# Patient Record
Sex: Male | Born: 1935
Health system: Southern US, Community
[De-identification: ages and names within clinical notes are randomized; demographics above are authoritative.]

## PROBLEM LIST (undated history)

## (undated) DIAGNOSIS — I2510 Atherosclerotic heart disease of native coronary artery without angina pectoris: Secondary | ICD-10-CM

## (undated) DIAGNOSIS — I10 Essential (primary) hypertension: Secondary | ICD-10-CM

## (undated) DIAGNOSIS — F17201 Nicotine dependence, unspecified, in remission: Secondary | ICD-10-CM

## (undated) DIAGNOSIS — Z9289 Personal history of other medical treatment: Secondary | ICD-10-CM

## (undated) DIAGNOSIS — E785 Hyperlipidemia, unspecified: Secondary | ICD-10-CM

## (undated) HISTORY — DX: Atherosclerotic heart disease of native coronary artery without angina pectoris: I25.10

## (undated) HISTORY — DX: Essential (primary) hypertension: I10

## (undated) HISTORY — DX: Personal history of other medical treatment: Z92.89

## (undated) HISTORY — PX: CARDIAC CATHETERIZATION: SHX172

## (undated) HISTORY — PX: HERNIA REPAIR: SHX51

## (undated) HISTORY — DX: Hyperlipidemia, unspecified: E78.5

## (undated) HISTORY — DX: Nicotine dependence, unspecified, in remission: F17.201

---

## 2002-06-02 HISTORY — PX: CORONARY ARTERY BYPASS GRAFT: SHX141

## 2002-10-31 ENCOUNTER — Encounter: Payer: Self-pay | Admitting: Emergency Medicine

## 2002-10-31 ENCOUNTER — Emergency Department (HOSPITAL_COMMUNITY): Admission: EM | Admit: 2002-10-31 | Discharge: 2002-10-31 | Payer: Self-pay | Admitting: Emergency Medicine

## 2002-10-31 ENCOUNTER — Inpatient Hospital Stay (HOSPITAL_COMMUNITY): Admission: EM | Admit: 2002-10-31 | Discharge: 2002-11-06 | Payer: Self-pay | Admitting: *Deleted

## 2002-11-01 ENCOUNTER — Encounter: Payer: Self-pay | Admitting: Surgery

## 2002-11-01 ENCOUNTER — Encounter: Payer: Self-pay | Admitting: Cardiothoracic Surgery

## 2002-11-02 ENCOUNTER — Encounter: Payer: Self-pay | Admitting: Cardiothoracic Surgery

## 2002-11-03 ENCOUNTER — Encounter: Payer: Self-pay | Admitting: Cardiothoracic Surgery

## 2002-11-04 ENCOUNTER — Encounter: Payer: Self-pay | Admitting: Cardiothoracic Surgery

## 2002-11-06 ENCOUNTER — Encounter: Payer: Self-pay | Admitting: Cardiothoracic Surgery

## 2002-12-23 ENCOUNTER — Encounter: Admission: RE | Admit: 2002-12-23 | Discharge: 2002-12-23 | Payer: Self-pay | Admitting: Cardiothoracic Surgery

## 2002-12-23 ENCOUNTER — Encounter: Payer: Self-pay | Admitting: Cardiothoracic Surgery

## 2007-11-19 ENCOUNTER — Ambulatory Visit (HOSPITAL_COMMUNITY): Admission: RE | Admit: 2007-11-19 | Discharge: 2007-11-19 | Payer: Self-pay | Admitting: *Deleted

## 2007-11-23 ENCOUNTER — Ambulatory Visit (HOSPITAL_COMMUNITY): Admission: RE | Admit: 2007-11-23 | Discharge: 2007-11-23 | Payer: Self-pay | Admitting: Cardiology

## 2010-10-18 NOTE — Discharge Summary (Signed)
NAME:  TEAL, BONTRAGER NO.:  [id number]   MEDICAL RECORD NO.:  [id number]                   PATIENT TYPE:  INP   LOCATION:  2034                                 FACILITY:  [facility]   PHYSICIAN:  Kerin Perna, M.D.               DATE OF BIRTH:  18-Sep-1924   DATE OF ADMISSION:  10/31/2002  DATE OF DISCHARGE:  11/06/2002                                 DISCHARGE SUMMARY   PRIMARY ADMITTING DIAGNOSIS:  Chest pain.   ADDITIONAL/DISCHARGE DIAGNOSES:  1. Coronary artery disease.  2. Hyperlipidemia.  3. Status post acute inferior myocardial infarction.  4. Benign prostatic hypertrophy.   PROCEDURES PERFORMED:  1. Cardiac catheterization.  2. Insertion of intra-aortic balloon pump.  3. Coronary artery bypass grafting x4 (left internal mammary artery to the     LAD, saphenous vein graft to the diagonal, saphenous vein graft to the     first obtuse marginal, saphenous vein graft to the distal right coronary     artery).  4. Endoscopic vein harvest right thigh.   HISTORY:  The patient is a 77 year old male who presented to the ER at [facility] complaining of chest pain which radiated to his right arm and  was associated with shortness of breath.  He was noted to have inferolateral  EKG changes and was, therefore, transferred to [facility] for  cardiac evaluation.  He was started on IV nitroglycerin, heparin,  Integrilin, and given an aspirin.  On admission he was seen by Dr. Jenne Campus  and was taken urgently to the catheterization laboratory for cardiac  catheterization.   HOSPITAL COURSE:  He was brought directly to the catheterization laboratory  and underwent cardiac catheterization which showed severe three-vessel  coronary artery disease including significant left main coronary stenosis.  Ejection fraction was about 45-55% with inferior hypokinesis.  He had an  intra-aortic balloon pump placed in the catheterization laboratory and  was  transferred to the floor in stable condition.  A cardiothoracic surgery  consultation was obtained, and it was felt that his best course of action  would be to proceed with surgical revascularization at that time.  He was  taken to the operating room on November 01, 2002, where he underwent CABG x4 as  described in detail above.  He tolerated the procedure well and was  transferred to the SICU in stable condition.  He was slowly weaned from the  ventilator and extubated.  He was hemodynamically stable and doing well on  postoperative day #1.  The intra-aortic balloon pump was slowly weaned and  discontinued later in the day on postoperative day #1.  He remained in the  ICU for further observation.  By postoperative day #2 he was stable and  ready for transfer to the floor.  Postoperatively he has done well.  He has  been ambulating in the halls without difficulty.  He has been  weaned off of  supplemental oxygen and is maintaining O2 saturations of 95% or greater on  room air.  His surgical incision sites are healing well.  He has been  started on Lasix for mild volume overload and is diuresing well.  He has  remained afebrile, and all vital signs have been stable throughout his  admission.  His laboratories have all remained stable, with most recent  hemoglobin and hematocrit 10.2 and 29.1 respectively.  All other  laboratories have been within normal limits.  If he remains stable, it is  thought he will be ready for discharge home on November 06, 2002.    DISCHARGE MEDICATIONS:  1. Enteric-coated aspirin 325 mg daily.  2. Lopressor 25 mg three times daily.  3. Altace 2.5 mg daily.  4. Zocor 30 mg q.h.s.  5. Hytrin 2 mg daily.  6. Tylox 1-2 q.4h. p.r.n. for pain.   DISCHARGE INSTRUCTIONS:  He is to refrain from driving, heavy lifting, or  strenuous activity.  He may continue walking daily and use his incentive  spirometer.  He is asked to shower daily and clean his incisions with soap   and water.   DISCHARGE FOLLOW-UP:  He will make an appointment to see Dr. Jenne Campus in two  weeks.  He will then follow up with Dr. Kathlee Nations Trigt in three weeks, and  our office will call to schedule this appointment.  He will go to the  [facility] for a chest x-ray one hour prior to this  appointment and will bring his films for Dr. Donata Clay to review.     Coral Ceo, P.A.                        Kerin Perna, M.D.    GC/MEDQ  D:  11/05/2002  T:  11/06/2002  Job:  [id number]   cc:   Kirk Ruths, M.D.  [address]  [place]  [place] [address]  Fax: [phone]

## 2010-10-18 NOTE — H&P (Signed)
NAME:  Brian Solomon, Brian Solomon                        ACCOUNT NO.:  [id number]   MEDICAL RECORD NO.:  [id number]                   PATIENT TYPE:  INP   LOCATION:                                       FACILITY:  [facility]   PHYSICIAN:  Darlin Priestly, M.D.             DATE OF BIRTH:  10/09/1924   DATE OF ADMISSION:  10/31/2002  DATE OF DISCHARGE:                                HISTORY & PHYSICAL   CHIEF COMPLAINT:  Chest pain.   HISTORY OF PRESENT ILLNESS:  Brian Solomon is a 77 year old male from  [place], [place], followed by Dr. Regino Schultze with no prior history of  coronary disease.  He presented to the emergency room at [facility]  with chest pain which radiated to his right arm associated with shortness of  breath.  He had some inferolateral elevation on EKG on arrival to [facility].  He was transferred to [facility] for  catheterization and further evaluation.  He was put on IV nitroglycerin.  He  was also started on heparin and given aspirin and Integrelin at [facility].  The patient was taken urgently to the catheterization lab by Dr.  Jenne Campus.   PAST MEDICAL HISTORY:  This is remarkably only for BPH.  He denies any  diabetes or hypertension.  No history of hyperlipidemia.   MEDICATIONS:  He takes Hytrin at home, otherwise no medications.   ALLERGIES:  No known drug allergies.   SOCIAL HISTORY:  He is married and has six children.  He is retired from  [employer] and does not smoke.   FAMILY HISTORY:  This is remarkable for coronary disease, his father, his  mother and one brother all died of an MI.   REVIEW OF SYSTEMS:  This is essentially unremarkable except for as noted  above.   PHYSICAL EXAMINATION:  GENERAL APPEARANCE:  He is a well-developed, well-  nourished African American male in no acute distress.  VITAL SIGNS:  On arrival blood pressure is 137/86, heart rate 56,  temperature 97, respiratory rate 12.  HEENT:  Normocephalic. Extraocular movements intact. Sclerae not icteric.  Lids and conjunctivae within normal limits.  NECK:  No JVD, no bruit.  CHEST:  Clear to auscultation bilaterally.  CARDIOVASCULAR:  Regular rate and rhythm without obvious murmurs, rubs, or  gallops.  Normal S1 and S2.  ABDOMEN:  Soft and nontender.  Bowel sounds are present.  There is no  obvious hepatosplenomegaly.  EXTREMITIES:  No edema.  Distal pulses are intact.  NEUROLOGIC:  Grossly intact.  He is awake, alert and oriented.  He is  cooperative and moves all extremities without obvious deficit.   LABORATORY DATA:  White count 7.9, hemoglobin 14.4, hematocrit 41.3,  platelets 133.  Sodium 137, potassium 3.7, BUN 18, creatinine 1.3, glucose  130.  CK 511, troponin 0.03.   Chest x-ray showed mild  pulmonary edema.   EKG showed sinus bradycardia with inferolateral ST elevation, inferior Qs.   IMPRESSION:  1. Acute diaphragmatic myocardial infarction.  2. History of benign prostatic hypertrophy.  3. Family history of coronary disease.   PLAN:  The patient was taken urgently to the catheterization lab by Dr.  Jenne Campus for further evaluation.     Brian Solomon, P.A.                      Darlin Priestly, M.D.    [name]  D:  11/03/2002  T:  11/03/2002  Job:  [id number]

## 2010-10-18 NOTE — Cardiovascular Report (Signed)
NAME:  Brian Solomon, Brian Solomon                        ACCOUNT NO.:  [id number]   MEDICAL RECORD NO.:  [id number]                   PATIENT TYPE:  INP   LOCATION:  2854                                 FACILITY:  [facility]   PHYSICIAN:  Darlin Priestly, M.D.             DATE OF BIRTH:  1925-05-20   DATE OF PROCEDURE:  10/31/2002  DATE OF DISCHARGE:                              CARDIAC CATHETERIZATION   PROCEDURE:  1. Left heart catheterization.  2. Coronary angiography.  3. Left ventriculogram.  4. Abdominal aortogram.  5. Insertion of intra-aortic balloon pump.   CARDIOLOGIST:  Darlin Priestly, M.D.   COMPLICATIONS:  None.   INDICATIONS:  The patient is a 77 year old male, patient of Dr. Regino Schultze in  [place], [place], with a history of BPH.  He presented to [facility] ER at approximately at 5 p.m. on Oct 31, 2002, with one hour of  substernal chest pain which occurred while working on his air conditioner at  home.  This was associated with nausea, diaphoresis, and emesis.  At  presentation to the ER, he was noted to have inferior and lateral ST  elevation.  He is now brought for emergent cardiac catheterization.  Of  note, the patient did have one episode of chest pain approximately one month  ago which was similar to this episode but not as severe. The patient has no  known history of coronary artery disease.   DESCRIPTION OF PROCEDURE:  After given informed written consent, the patient  was brought to the cardiac catheterization lab where his right and left  groins were shaved, prepped, and draped in the usual sterile fashion.  ECG  monitoring was established.  Using modified Seldinger technique, arterial  access was obtained in the right femoral artery and a #6 French arterial  sheath was inserted without complications.  Then, 6 French diagnostic  catheters were then used to perform diagnostic angiography.  This reveals a  large left main which is noted to be calcified  with 70% ostial stenosis.  There is pressure damping.   ANGIOGRAPHIC DATA:  1. The left anterior descending is a large vessel that courses to the apex     and gives rise to one large diagonal branch.  The LAD is diffusely     diseased in its proximal segment up to 70% with a mid 90% stenotic lesion     in the LAD.  2. There is one large diagonal which bifurcates in the mid segment.  It has     a 50% stenosis in the lower proximal limb of the bifurcation.  3. The left circumflex is a medium size vessel which courses to the AV     groove and gives rise to two obtuse marginal branches.  The AV groove     circumflex has no significant disease.  The first OM is a large vessel     with an 80%  proximal lesion.  The second OM is a small vessel with no     significant disease.  4. The right coronary artery is a large vessel which is dominant and gives     rise to the PDA as well as the posterior lateral branch.  There RCA has a     diffuse 70% proximal stenosis.  There is diffuse 70% mid RCA stenosis     with visible thrombus.  There is TIMI III flow into the distal vessel.     The PDA and posterior lateral branches are large vessels with no     significant disease.   LEFT VENTRICULOGRAM:  The left ventriculogram reveals mildly depressed EF at  45-50% with inferior hypokinesis.   ABDOMINAL AORTOGRAM:  The abdominal aortogram reveals 30% right and left  proximal renal artery stenosis.   HEMODYNAMICS:  1. Systemic arterial pressure is 100/58.  2. LV systolic pressure is 103/12.  3. LVEDP is 20.   DESCRIPTION OF PROCEDURE:  The right femoral sheath was then removed, and an  8 French intra-aortic balloon pump and sheath was then inserted without  difficulty into the right femoral artery.  The intra-aortic balloon pump was  then placed over the guidewire just distal to the left subclavian.  The  balloon pump was filled and pulsation was confirmed.  The patient did have a  good pulse in his  left brachial and radial artery with counter pulsation.   CONCLUSION:  1. Significant left main and three vessel coronary artery disease.  2. Mildly __________ systolic function with wall motion abnormalities as     noted above.  3. Mild bilateral renal artery stenosis.  4. Elevated left ventricular end-diastolic pressure.  5. Insertion of intra-aortic balloon pump through the right femoral artery     without complications.  6. Adjuvant use of Integrelin infusion.                                               Darlin Priestly, M.D.    RHM/MEDQ  D:  10/31/2002  T:  10/31/2002  Job:  [id number]   cc:   Kirk Ruths, M.D.  [address]  [place]  [place] [address]  Fax: [phone]

## 2010-10-18 NOTE — Consult Note (Signed)
NAME:  Brian Solomon, Brian Solomon                        ACCOUNT NO.:  [id number]   MEDICAL RECORD NO.:  [id number]                   PATIENT TYPE:  INP   LOCATION:  2854                                 FACILITY:  [facility]   PHYSICIAN:  Evelene Croon, M.D.                  DATE OF BIRTH:  10/18/1924   DATE OF CONSULTATION:  10/31/2002  DATE OF DISCHARGE:                                   CONSULTATION   REFERRING PHYSICIAN:  Darlin Priestly, M.D.   REASON FOR CONSULTATION:  Left main and severe three-vessel coronary artery  disease, status post acute inferior MI.   HISTORY OF PRESENT ILLNESS:  This patient is a 77 year old, previously  healthy gentleman, who has been asymptomatic until this afternoon when he  developed sudden onset of substernal chest pain.  There was severe radiation  into the right arm associated with shortness of breath, nausea and emesis  x2.  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  He said that  his symptoms improved quickly and he resisted coming to the hospital  initially, but his wife and daughter convinced him to go to the [facility]  Emergency Room.  He was noted to have inferolateral ST elevation on EKG.  He  was transferred to [facility] and was pain free on arrival with persistent  ST elevation while on Integrillin and intravenous nitroglycerin.  He did  have a bradycardic episode into the 30s.  He underwent emergent  catheterization which showed calcified 70% osteal left main stenosis with a  damping on catheter engagement.  The LAD had a 70% proximal stenosis at the  take off of the large diagonal branch.  There is about 90% mid LAD stenosis  beyond this.  The left circumflex had a large first marginal and 80%  stenosis.  The right coronary artery appeared to be the culprit with a 70%  proximal stenosis and a 90% mid vessel stenosis with thrombus present.  This  was a large vessel.  Left ventricular ejection fraction was about 45-55%  with  inferior hypokinesis.  He had intra-aortic balloon pump placed in the  catheterization lab due to his anatomy.  He remained pain free and  hemodynamically stable.   PAST MEDICAL HISTORY:  Otherwise negative.  He denies diabetes,  hypercholesterolemia and hypertension.  He has had BPH.   PAST SURGICAL HISTORY:  Status post hernia repair.   ALLERGIES:  No known drug allergies.   REVIEW OF SYMPTOMS:  GENERAL:  He denies fever or chills.  No recent weight  changes.  HEENT:  Eyes negative.  ENDOCRINE:  Denies diabetes and  hypothyroidism.  CARDIOVASCULAR:  Denies any previous history of chest pain  or pressure.  He has had no palpitations.  He denies PND and orthopnea.  He  has had no peripheral edema.  He denies fatigue.  RESPIRATORY:  Denies cough  and sputum  production.  He has had no shortness of breath.  GASTROINTESTINAL:  He has had no nausea or vomiting.  He denies melena and  bright red blood per rectum.  GENITOURINARY:  He denies dysuria and  hematuria.  He does have BPH.  NEUROLOGIC:  No focal weakness or numbness.  He denies dizziness and syncope.  He has never had a TIA or stroke.  MUSCULOSKELETAL:  He denies arthralgias and myalgias.  VASCULAR:  Denies  claudication.   SOCIAL HISTORY:  He is married and has six children.  He retired from  [employer].  He does not smoke or drink alcohol.   FAMILY HISTORY:  Strongly positive for heart disease.  Both his parents died  with myocardial infarction and one brother died with myocardial infarction.   PHYSICAL EXAMINATION:  VITAL SIGNS:  Blood pressure 130/80, pulse 56 and  regular, respirations 18 and unlabored.  GENERAL:  Well-developed African-American male in no distress.  HEENT:  Normocephalic, atraumatic.  Pupils equal round and reactive to light  and accommodation.  Extraocular movements intact.  Throat is clear.  NECK:  Normal carotid pulses bilaterally.  There are no bruits.  There is no  adenopathy or thyromegaly.   CARDIAC:  Regular rate and rhythm with a normal S1, S2.  There are no  murmurs, rubs or gallops.  LUNGS:  Clear.  ABDOMEN:  Active bowel sounds.  Soft and nontender.  There are no palpable  masses or organomegaly.  EXTREMITIES:  No peripheral edema.  Pedal pulses palpable bilaterally.   LABORATORY DATA AND X-RAY FINDINGS:  Electrocardiogram shows normal sinus  rhythm with inferolateral ST elevation.  Chest x-ray shows cardiomegaly with  questionable interstitial edema.  There are no lung lesions seen.   Hemoglobin 14.4, platelet count 177,000, white blood cell count 7.9.  Liver  function profile was normal.  Electrolytes are normal with a BUN of 18,  creatinine 1.3.  His CPK on arrival to [facility] was 511 with an MB of 6.4  and troponin I level of 6.3.   IMPRESSION:  This patient has left main and severe three-vessel coronary  disease with high-grade right coronary stenosis with thrombus present and  acute inferior MI.   RECOMMENDATIONS:  I agree that coronary bypass graft surgery is the best  treatment for this patient.  This should be performed tomorrow.  I see no  reason to perform surgery emergently since he is currently pain free and  hemodynamically stable.  I discussed the operative procedure with the  patient and his wife and family including alternatives, benefits and risks  which included bleeding, blood transfusion, infection, stroke, myocardial  infarction and death.  Also discussed the likelihood that Dr. Donata Clay  would be the one doing his surgery since I am going to be in the office  tomorrow.  They are in agreement with this.                                               Evelene Croon, M.D.    BB/MEDQ  D:  10/31/2002  T:  10/31/2002  Job:  [id number]

## 2010-10-18 NOTE — Op Note (Signed)
NAME:  Brian Solomon, Brian Solomon                        ACCOUNT NO.:  [id number]   MEDICAL RECORD NO.:  [id number]                   PATIENT TYPE:  INP   LOCATION:  2308                                 FACILITY:  [facility]   PHYSICIAN:  Kerin Perna III, M.D.           DATE OF BIRTH:  1925-01-13   DATE OF PROCEDURE:  11/01/2002  DATE OF DISCHARGE:                                 OPERATIVE REPORT   OPERATION:  1. Coronary artery bypass grafting x4; (left IMA to LAD, saphenous vein     graft to diagonal, saphenous vein graft to obtuse marginal, saphenous     vein graft to distal RCA).  2. Placement of pulmonary artery Swan-Ganz catheter.   SURGEON:  Kerin Perna, M.D.   ASSISTANT:  Toribio Harbour, N.P.   ANESTHESIA:  General.   ANESTHESIOLOGIST:  Guadalupe Maple, M.D.   INDICATIONS:  The patient is a 77 year old black male who presented with  acute onset of chest pain, and ruled in for MI with an acute BMI.  Cardiac  catheterization by Dr. Jenne Campus performed urgently demonstrated a high-grade  99% stenosis of the right coronary artery, 95% stenosis of the LAD, 80%  stenosis of the left main and 80% stenosis of the OM1.  He is felt to be a  candidate for surgical coronary revascularization, and a surgical  consultation was requested.  While in the cardiac catheterization lab a  balloon pump was placed for his persistent chest pain and bad coronary  anatomy.   Prior to surgery the patient was initially examined by Dr. Rexanne Mano, who  reviewed the situation with the cardiologist, the family, and the patient.  He scheduled the surgery to be performed by myself on November 01, 2002.  I had  met with the patient and family on the morning of November 01, 2002 in the ICU  prior to surgery, and reviewed the indications, benefits and risks of  coronary bypass surgery for treatment of his severe coronary disease.  The  patient understood that the procedure would improve his myocardial blood  flow  and relieve his symptoms, and increase his survival.  He understood the  risks he would be exposed to, including MI, CVA, bleeding, blood transfusion  requirement, infection and death.  He understood these implications for the  surgery, including the risks, and agreed to proceed with the operation as  planned under what I felt was an informed consent.   OPERATIVE FINDINGS:  The saphenous vein was harvested endoscopically from  the right thigh and was of good quality.  The mammary artery was a good  vessel with excellent flow.  The coronaries were good targets for grafting.  The patient had a clear coagulopathy from his preoperative Integrilin and  Heparin, and was given platelets and FFP in the operating room for  coagulopathy.  He was also given one unit of packed cells in the operating  room  for a hemoglobin less than 8.0 g.  The balloon pump was used as  previously placed in the catheterization lab.   DESCRIPTION OF PROCEDURE:  The patient was brought to the operating room and  placed supine on the operating room table, where general anesthesia was  induced.  The chest was prepped.  The anesthesiologist asked me to place the  Swan-Ganz catheter.  I prepped and draped the right subclavian area.  I  infiltrated the area with 1% lidocaine.  The subclavian vein was cannulated,  and a sheath was placed over a guide wire.  A Swan-Ganz catheter was then  floated into the pulmonary artery using the hemodynamic monitoring to  confirm placement.  The sheath and catheter were secured with a 2-0 silk  ligature.   The patient was then prepped from chin to toes, and draped as a sterile  field.  A sternal incision was made and the saphenous vein was harvested  from the right thigh, using the endoscopic vein technique.  The left  internal mammary artery was harvested as a pedicle graft from its origin at  the subclavian vessels.  It was a good vessel with excellent flow.  Heparin  was administered.   The sternal retractor was placed.  Pursestrings were  placed in the ascending aorta and right atrium, and the patient was  cannulated after confirming adequate ACT in the therapeutic range.  After  cannulating the aorta and right atrium, the patient was placed on bypass.  The aorta was examined and was mildly dilated; however, no more than 4 cm.  The coronaries were identified for grafting in the LAD, diagonal, OM1, and  right coronary artery were found to be adequate targets for grafting.  The  mammary artery and vein grafts were prepared for the distal anastomoses, and  cardioplegia catheters were placed for both antegrade and retrograde  delivery of cold blood cardioplegia.  The patient was cooled to 30 degrees.  As the aortic crossclamp was applied, a total of 750 cc of cold blood  cardioplegia was delivered in split doses between the antegrade aortic and  retrograde coronary sinus catheters.  There was a good cardioplegia address,  with septal temperature dropping less than 14 degrees.  Topical iced saline  slush was used to augment myocardial preservation and a pericardial  insulator pad was used to protect the left phrenic nerve.   The distal coronary anastomoses were then performed.  The first distal  anastomosis was of the distal RCA, just prior to the bifurcation.  This  vessel was a 1.8 - 2.0 mm vessel, and had a proximal 99% stenosis.  A  reverse saphenous vein was sewn end-to-side with running 7-0 Prolene, with  good flow through the graft.   The second distal anastomosis was placed at the OM1.  This was  intramyocardial, and was located just under the left atrial appendage.  This  was a 1.5 mm vessel and had a proximal 80% stenosis.  The reverse saphenous  vein was sewn end-to-side with running 7-0 Prolene, and there was good flow  through the graft.  Cardioplegia was redosed.   The third distal anastomosis was of the diagonal branch of the LAD.  This had a proximal 80-90%  stenosis.  It was a 1.5 mm vessel.  A reverse  saphenous vein was sewn end-to-side with running 7-0 Prolene.  There was  good flow through the graft.  Cardioplegia was redosed.   The fourth distal anastomosis was the mid  portion of the LAD.  This had a  proximal, long 95% stenosis.  The left internal mammary artery pedicle was  brought up through an opening, created in the left lateral pericardium.  It  was brought down onto the LAD and sewn end-to-side with a running 8-0  Prolene.  There was excellent flow through the anastomosis, which was  briefly opened with the release of the bulldog vascular clamp.  This clamp  was then reapplied and the pedicle was secured to the epicardium with two 6-  0 sutures.   After redosing cardioplegia, three proximal vein anastomoses were placed on  the ascending aorta, while the crossclamp remained in place.  Then 4.0 mm  punch and running 6-0 Prolene was used to construct three proximal venous  anastomoses.  Air was removed from the left side of the heart, using a dose  of warn blood retrograde cardioplegia (prior to release of the crossclamp  after the last proximal anastomosis suture line had been tied).   The heart resumed a spontaneous rhythm.  There was good flow through all  grafts, and hemostasis was documented at the proximal and distal  anastomoses.  The patient was rewarmed to 37 degrees.  Temporary pacing  wires were applied. The lungs were expanded and the ventilator was resumed.  When the patient reached 37 degrees he was weaned from bypass on __________  balloon pump at 1:1 augmentation.  There was stable blood pressure and good  hemodynamics.  Protamine was administered.  There was no adverse reaction to  the Protamine.  The cannulae were removed.  The mediastinum was irrigated  with warm antibiotic irrigation.  Platelets and SST were given for Evan's  coagulopathy.  A packed cell transfusion was given for a previous hemoglobin  of less  than  8 g.   This patient remained hemodynamically stable.  The mediastinum was irrigated  with warm antibiotic irrigation.  The leg incision was irrigated and closed  in a standard fashion.  The superior pericardium was closed with interrupted  sutures.  Two mediastinal and a left pleural chest tube were placed, and  brought out through separate incisions.  The sternum was reapproximated with  interrupted steel wire.  The pectoralis fascia was closed with interrupted  #1 Vicryl.  The subcutaneous and skin were closed with running Vicryl, and  sterile dressings were applied.   TOTAL BYPASS TIME:  120 min.   AORTIC CROSSCLAMP TIME:  75 min.                                               Mikey Bussing, M.D.    PV/MEDQ  D:  11/01/2002  T:  11/01/2002  Job:  [id number]   cc:   Darlin Priestly, M.D.  [phone] [address], [address]  [place]  [place] [address]  Fax: [phone]

## 2010-10-18 NOTE — Discharge Summary (Signed)
   NAME:  TOSH, GLAZE NO.:  [id number]   MEDICAL RECORD NO.:  [id number]                   PATIENT TYPE:  INP   LOCATION:  2034                                 FACILITY:  [facility]   PHYSICIAN:  Kerin Perna, M.D.               DATE OF BIRTH:  [birth year]   DATE OF ADMISSION:  10/31/2002  DATE OF DISCHARGE:  11/06/2002                                 DISCHARGE SUMMARY   ADDENDUM   Mr. Creasy was scheduled for discharge on November 06, 2002.  Overnight on November 05, 2002, he developed fever up to 101 degrees.  He is completely  asymptomatic and by the morning had returned to a baseline temperature of 97-  98 degrees.  The physical examination revealed no sources for fever.  A CBC  was obtained, which showed a white blood cell count of 9000, which was  stable from his previous labs.  A urinalysis was also obtained, which was  negative.  Chest x-ray showed some left lower lobe atelectasis, which had  been persistent since the time of surgery.  Also, his heart rate had  intermittently been elevated up into the low 100s overnight.  When he was  seen by cardiology, his Lopressor dose was increased to 50 mg b.i.d. and  this did trend downward after the administration of the first dose.  He was  observed closely throughout the morning and by lunchtime was deemed to be  stable and ready for discharge home.   DISCHARGE MEDICATIONS:  1. Enteric-coated aspirin 325 mg daily.  2. Lopressor 50 mg b.i.d.  3. Altace 2.5 mg daily.  4. Zocor 40 mg q.h.s.  5. __________ 10 mg daily.  6. Tylox one to two q.4h. p.r.n. for pain.   All of the remaining discharge instructions are unchanged from his  previously dictated discharge summary.     Coral Ceo, P.A.                        Kerin Perna, M.D.    GC/MEDQ  D:  11/06/2002  T:  11/06/2002  Job:  [id number]   cc:   [facility]

## 2010-12-23 ENCOUNTER — Encounter: Payer: Self-pay | Admitting: Cardiology

## 2010-12-23 ENCOUNTER — Ambulatory Visit (INDEPENDENT_AMBULATORY_CARE_PROVIDER_SITE_OTHER): Payer: Medicare Other | Admitting: Cardiology

## 2010-12-23 DIAGNOSIS — I10 Essential (primary) hypertension: Secondary | ICD-10-CM

## 2010-12-23 DIAGNOSIS — I2510 Atherosclerotic heart disease of native coronary artery without angina pectoris: Secondary | ICD-10-CM

## 2010-12-23 DIAGNOSIS — F17201 Nicotine dependence, unspecified, in remission: Secondary | ICD-10-CM

## 2010-12-23 DIAGNOSIS — E785 Hyperlipidemia, unspecified: Secondary | ICD-10-CM

## 2010-12-23 DIAGNOSIS — Z87891 Personal history of nicotine dependence: Secondary | ICD-10-CM

## 2010-12-23 NOTE — Progress Notes (Signed)
HPI:  [name] is a remarkably youthful-appearing and fit 77 year old with coronary artery disease self-referred for continuing cardiology care.  Since bypass surgery in 2004, he has done extremely well.  He denies all cardiopulmonary symptoms and has had excellent control of cardiovascular risk factors.  He was last seen by a cardiologist approximately 18 months ago.  A stress nuclear study in the past has shown minor inferior ischemia prompting cardiac catheterization in 2009 at which time a 60% left main lesion was seen as well as atrophy of the LIMA to the LAD and occlusion of an SVG to the first marginal.  Patient remains asymptomatic with good exercise tolerance and normal LV systolic function.  Current Outpatient Prescriptions  Medication Sig Dispense Refill  . aspirin 81 MG tablet Take 81 mg by mouth daily.        . clopidogrel (PLAVIX) 75 MG tablet Take 75 mg by mouth daily.        Marland Kitchen ezetimibe-simvastatin (VYTORIN) 10-40 MG per tablet Take 1 tablet by mouth at bedtime.        . metoprolol tartrate (LOPRESSOR) 25 MG tablet Take 25 mg by mouth as directed. Take 1/2 tab bid        . Tamsulosin HCl (FLOMAX) 0.4 MG CAPS Take 0.4 mg by mouth daily after supper.           No Known Allergies    Past Medical History  Diagnosis Date  . Arteriosclerotic cardiovascular disease (ASCVD)     acute IMI in 2004 with subsequent CABG; caffeine 2009 60% left main; atretic LIMA; occluded SVG to M1; patent grafts to RCA and D1;  EF 40-45% with inferior hypokinesis on echo in 3/11; pharmacologic stress nuclear in 3/11-EF of 51%, mild basilar inferolateral ischemia  . Hyperlipidemia     Lipid profile in 2009:159, 57, 72, 76  . Tobacco abuse, in remission     Quit in 1978  . Hypertension      Past Surgical History  Procedure Date  . Coronary artery bypass graft 2004     Family History  Problem Relation Age of Onset  . Heart disease Father     and a brother  . Stroke Mother     and brother       History   Social History  . Marital Status: Married    Spouse Name: N/A    Number of Children: N/A  . Years of Education: N/A   Occupational History  . Not on file.   Social History Main Topics  . Smoking status: Former Smoker -- 0.5 packs/day for 4 years    Types: Cigarettes    Quit date: 12/22/1965  . Smokeless tobacco: Never Used  . Alcohol Use: No  . Drug Use: No  . Sexually Active: Not on file   Other Topics Concern  . Not on file   Social History Narrative  . No narrative on file     ROS:  Requires corrective lenses for near vision; left cataract extraction; mild hearing impairment; urinary frequency; arthritic discomfort of the right elbow; right leg edema.   All other systems reviewed and are negative.  PHYSICAL EXAM: BP 139/88  Pulse 67  Ht 5\' 4"  (1.626 m)  Wt 68.947 kg (152 lb)  BMI 26.09 kg/m2  SpO2 94%  General-Well-developed; no acute distress Body Habitus-proportionate weight and height HEENT-[place]/AT; PERRL; EOM intact; conjunctiva and lids nl Neck-No JVD; no carotid bruits Endocrine-No thyromegaly Lungs-Clear lung fields; resonant percussion; normal I-to-E ratio  Cardiovascular- normal PMI; normal S1; increased intensity of S2 with normal split Abdomen-BS normal; soft and non-tender without masses or organomegaly Musculoskeletal-No deformities, cyanosis or clubbing Neurologic-Nl cranial nerves; symmetric strength and tone Skin- Warm, no significant lesions Extremities-Nl distal pulses; trace edema   ASSESSMENT AND PLAN:

## 2010-12-23 NOTE — Patient Instructions (Signed)
Your physician recommends that you continue on your current medications as directed. Please refer to the Current Medication list given to you today.  Your physician recommends that you schedule a follow-up appointment in: 1 year  

## 2010-12-26 NOTE — Progress Notes (Signed)
Records requested and received from Dr. Edison Simon office and placed in Dr. Marvel Plan records file./LV

## 2010-12-29 ENCOUNTER — Encounter: Payer: Self-pay | Admitting: Cardiology

## 2011-01-02 ENCOUNTER — Encounter: Payer: Self-pay | Admitting: Cardiology

## 2011-01-02 DIAGNOSIS — I2510 Atherosclerotic heart disease of native coronary artery without angina pectoris: Secondary | ICD-10-CM | POA: Insufficient documentation

## 2011-01-02 DIAGNOSIS — F17201 Nicotine dependence, unspecified, in remission: Secondary | ICD-10-CM | POA: Insufficient documentation

## 2011-01-02 DIAGNOSIS — E785 Hyperlipidemia, unspecified: Secondary | ICD-10-CM | POA: Insufficient documentation

## 2011-01-02 DIAGNOSIS — I10 Essential (primary) hypertension: Secondary | ICD-10-CM | POA: Insufficient documentation

## 2011-01-02 NOTE — Assessment & Plan Note (Signed)
Performance status is excellent.  We will concentrate on maintaining that and optimally controlling risk factors.

## 2011-01-02 NOTE — Assessment & Plan Note (Signed)
Recent lipid profile is good.  Current medication will be continued.

## 2011-01-02 NOTE — Assessment & Plan Note (Signed)
Good blood pressure control.  Continue current regimen.

## 2011-01-14 ENCOUNTER — Encounter: Payer: Self-pay | Admitting: Cardiology

## 2011-01-31 ENCOUNTER — Other Ambulatory Visit: Payer: Self-pay

## 2011-01-31 MED ORDER — CLOPIDOGREL BISULFATE 75 MG PO TABS: 75.0000 mg | ORAL_TABLET | Freq: Every day | ORAL | Status: DC

## 2011-04-01 ENCOUNTER — Other Ambulatory Visit: Payer: Self-pay | Admitting: *Deleted

## 2011-04-01 ENCOUNTER — Other Ambulatory Visit: Payer: Self-pay | Admitting: Cardiology

## 2011-04-01 MED ORDER — EZETIMIBE-SIMVASTATIN 10-40 MG PO TABS: 1.0000 | ORAL_TABLET | Freq: Every day | ORAL | Status: DC

## 2011-08-11 ENCOUNTER — Other Ambulatory Visit: Payer: Self-pay | Admitting: Cardiology

## 2011-08-11 MED ORDER — METOPROLOL TARTRATE 25 MG PO TABS: 25.0000 mg | ORAL_TABLET | ORAL | Status: DC

## 2011-12-11 ENCOUNTER — Encounter: Payer: Self-pay | Admitting: Cardiology

## 2011-12-15 ENCOUNTER — Ambulatory Visit (INDEPENDENT_AMBULATORY_CARE_PROVIDER_SITE_OTHER): Payer: Medicare Other | Admitting: Cardiology

## 2011-12-15 ENCOUNTER — Encounter: Payer: Self-pay | Admitting: Cardiology

## 2011-12-15 VITALS — BP 136/75 | HR 64 | Ht 64.0 in | Wt 153.0 lb

## 2011-12-15 DIAGNOSIS — I7090 Unspecified atherosclerosis: Secondary | ICD-10-CM

## 2011-12-15 DIAGNOSIS — I10 Essential (primary) hypertension: Secondary | ICD-10-CM

## 2011-12-15 DIAGNOSIS — E782 Mixed hyperlipidemia: Secondary | ICD-10-CM

## 2011-12-15 DIAGNOSIS — F17201 Nicotine dependence, unspecified, in remission: Secondary | ICD-10-CM

## 2011-12-15 DIAGNOSIS — I2510 Atherosclerotic heart disease of native coronary artery without angina pectoris: Secondary | ICD-10-CM

## 2011-12-15 DIAGNOSIS — E785 Hyperlipidemia, unspecified: Secondary | ICD-10-CM

## 2011-12-15 DIAGNOSIS — Z87891 Personal history of nicotine dependence: Secondary | ICD-10-CM

## 2011-12-15 NOTE — Assessment & Plan Note (Addendum)
Patient continues to do astoundingly well despite a less than optimal outcome from CABG surgery and mild asymptomatic stress-induced ischemia.  Optimal medical management will be continued.  Clopidogrel was added empirically, likely is not providing significant benefit, and thus will be discontinued.

## 2011-12-15 NOTE — Progress Notes (Deleted)
Name: Brian Solomon    DOB: July 18, 1924  Age: 78 y.o.  MR#: [id number]       PCP:  Kirk Ruths, MD      Insurance: @PAYORNAME @   CC:   No chief complaint on file.   VS BP 136/75  Pulse 64  Ht 5\' 4"  (1.626 m)  Wt 153 lb (69.4 kg)  BMI 26.26 kg/m2  Weights Current Weight  12/15/11 153 lb (69.4 kg)  12/23/10 152 lb (68.947 kg)    Blood Pressure  BP Readings from Last 3 Encounters:  12/15/11 136/75  12/23/10 139/88     Admit date:  (Not on file) Last encounter with RMR:  12/11/2011   Allergy No Known Allergies  Current Outpatient Prescriptions  Medication Sig Dispense Refill  . aspirin 81 MG tablet Take 81 mg by mouth daily.        Marland Kitchen ezetimibe-simvastatin (VYTORIN) 10-40 MG per tablet Take 1 tablet by mouth at bedtime.  30 tablet  12  . metoprolol tartrate (LOPRESSOR) 25 MG tablet Take 1 tablet (25 mg total) by mouth as directed. Take 1/2 tab bid  30 tablet  6  . PLAVIX 75 MG tablet TAKE 1 TABLET ONCE DAILY.  30 each  12  . Tamsulosin HCl (FLOMAX) 0.4 MG CAPS Take 0.4 mg by mouth daily after supper.          Discontinued Meds:   There are no discontinued medications.  Patient Active Problem List  Diagnosis  . Arteriosclerotic cardiovascular disease (ASCVD)  . Hyperlipidemia  . Tobacco abuse, in remission  . Hypertension    LABS No results found for any previous visit.   Results for this Opt Visit:    No results found for this or any previous visit.  EKG Orders placed in visit on 01/14/11  . EXERCISE TOLERANCE TEST     Prior Assessment and Plan Problem List as of 12/15/2011            Cardiology Problems   Arteriosclerotic cardiovascular disease (ASCVD)   Last Assessment & Plan Note   12/23/2010 Office Visit Signed 01/02/2011  1:07 PM by Kathlen Brunswick, MD    Performance status is excellent.  We will concentrate on maintaining that and optimally controlling risk factors.    Hyperlipidemia   Last Assessment & Plan Note   12/23/2010 Office Visit  Signed 01/02/2011  1:07 PM by Kathlen Brunswick, MD    Recent lipid profile is good.  Current medication will be continued.    Hypertension   Last Assessment & Plan Note   12/23/2010 Office Visit Signed 01/02/2011  1:08 PM by Kathlen Brunswick, MD    Good blood pressure control.  Continue current regimen.      Other   Tobacco abuse, in remission       Imaging: No results found.   FRS Calculation: Score not calculated

## 2011-12-15 NOTE — Assessment & Plan Note (Addendum)
Patient's blood pressure was mildly elevated at his last visit with [facility]; however, he does not recall the actual value.  He does not measure blood pressure at home, but when assessed in other venues, systolics are typically less than 140.  At this point, control of hypertension appears adequate with current therapy, which is modest and will be continued.  Appropriate laboratory studies are pending.  I will reassess this nice gentleman again next year.

## 2011-12-15 NOTE — Patient Instructions (Addendum)
Your physician recommends that you schedule a follow-up appointment in: 1 year  Your physician recommends that you return for lab work in: TODAY  Your physician has recommended you make the following change in your medication:  1 - STOP Plavix

## 2011-12-15 NOTE — Assessment & Plan Note (Signed)
Lipid profile will be repeated and therapy adjusted accordingly.

## 2011-12-15 NOTE — Progress Notes (Signed)
Patient ID: Brian Solomon, male   DOB: Jun 07, 1924, 78 y.o.   MRN: [id number]  HPI: Scheduled return visit for this delightful older gentleman with hypertension and coronary artery disease.  Since CABG surgery in 2004, he has done superbly with no cardiopulmonary symptoms and an excellent performance status.  He continues to function at a high level without any significant health problems.  Clopidogrel was added to his regime In 2009 following a catheterization which revealed moderate left main disease, atrophy of the LIMA graft and occlusion of a vein graft to the first marginal.  Patient was asymptomatic at the time but had a mildly abnormal stress test.  Prior to Admission medications   Medication Sig Start Date End Date Taking? Authorizing Provider  aspirin 81 MG tablet Take 81 mg by mouth daily.     Yes Historical Provider, MD  ezetimibe-simvastatin (VYTORIN) 10-40 MG per tablet Take 1 tablet by mouth at bedtime. 04/01/11  Yes Kathlen Brunswick, MD  metoprolol tartrate (LOPRESSOR) 25 MG tablet Take 1 tablet (25 mg total) by mouth as directed. Take 1/2 tab bid 08/11/11  Yes Kathlen Brunswick, MD  PLAVIX 75 MG tablet TAKE 1 TABLET ONCE DAILY. 04/01/11  Yes Kathlen Brunswick, MD  Tamsulosin HCl (FLOMAX) 0.4 MG CAPS Take 0.4 mg by mouth daily after supper.     Yes Historical Provider, MD   No Known Allergies    Past medical history, social history, and family history reviewed and updated.  ROS: Denies chest pain, dyspnea, orthopnea, PND, pedal edema, lightheadedness or syncope.  All other systems reviewed and are negative.  PHYSICAL EXAM: BP 136/75  Pulse 64  Ht 5\' 4"  (1.626 m)  Wt 69.4 kg (153 lb)  BMI 26.26 kg/m2  General-Well developed; no acute distress; mentally sharp Body habitus-proportionate weight and height Neck-No JVD; no carotid bruits Lungs-clear lung fields; resonant to percussion Cardiovascular-normal PMI; normal S1 and S2; S4 present Abdomen-normal bowel sounds; soft and  non-tender without masses or organomegaly Musculoskeletal-No deformities, no cyanosis or clubbing Neurologic-Normal cranial nerves; symmetric strength and tone Skin-Warm, no significant lesions Extremities-distal pulses intact; no edema   ASSESSMENT AND PLAN:   [name], MD 12/15/2011 2:39 PM

## 2011-12-16 ENCOUNTER — Encounter: Payer: Self-pay | Admitting: *Deleted

## 2011-12-17 ENCOUNTER — Encounter: Payer: Self-pay | Admitting: Cardiology

## 2011-12-19 LAB — LIPID PANEL
LDL Cholesterol: 88 mg/dL (ref 0–99)
VLDL: 11 mg/dL (ref 0–40)

## 2012-01-29 ENCOUNTER — Telehealth: Payer: Self-pay | Admitting: Cardiology

## 2012-01-29 NOTE — Telephone Encounter (Signed)
Pt needs to switch off vytorin due to co-payment being $90 can we switch him to something cheaper? Please call new rx to [facility]

## 2012-01-29 NOTE — Telephone Encounter (Signed)
Please advise 

## 2012-02-01 NOTE — Telephone Encounter (Signed)
Discontinue Vytorin. Atorvastatin 40 mg per day Fasting lipid profile in one month.

## 2012-02-03 ENCOUNTER — Encounter: Payer: Self-pay | Admitting: *Deleted

## 2012-02-03 ENCOUNTER — Other Ambulatory Visit: Payer: Self-pay | Admitting: *Deleted

## 2012-02-03 DIAGNOSIS — E782 Mixed hyperlipidemia: Secondary | ICD-10-CM

## 2012-02-03 MED ORDER — ATORVASTATIN CALCIUM 40 MG PO TABS: 40.0000 mg | ORAL_TABLET | Freq: Every day | ORAL | Status: DC

## 2012-02-03 NOTE — Telephone Encounter (Signed)
Patient notified of recommendations and chart updated to reflect changes.

## 2012-02-06 ENCOUNTER — Other Ambulatory Visit: Payer: Self-pay | Admitting: Cardiology

## 2012-03-04 ENCOUNTER — Other Ambulatory Visit: Payer: Self-pay | Admitting: Cardiology

## 2012-03-04 LAB — LIPID PANEL
Cholesterol: 174 mg/dL (ref 0–200)
HDL: 71 mg/dL (ref >39)
Total CHOL/HDL Ratio: 2.5 Ratio

## 2012-03-05 ENCOUNTER — Other Ambulatory Visit: Payer: Self-pay | Admitting: *Deleted

## 2012-03-05 DIAGNOSIS — E782 Mixed hyperlipidemia: Secondary | ICD-10-CM

## 2012-07-26 ENCOUNTER — Other Ambulatory Visit: Payer: Self-pay | Admitting: Cardiology

## 2012-07-26 MED ORDER — METOPROLOL TARTRATE 25 MG PO TABS: 12.5000 mg | ORAL_TABLET | Freq: Two times a day (BID) | ORAL | Status: DC

## 2012-10-05 ENCOUNTER — Encounter: Payer: Self-pay | Admitting: Nurse Practitioner

## 2012-10-05 NOTE — Progress Notes (Deleted)
Patient ID: Brian Solomon, male   DOB: 04-17-1925, 79 y.o.   MRN: [id number] A user error has taken place: {[id number]}.  This encounter was created in error - please disregard. This encounter was created in error - please disregard. This encounter was created in error - please disregard.

## 2012-10-05 NOTE — Progress Notes (Deleted)
errSubjective:     Patient ID: Brian Solomon, male   DOB: [birth year], 79 y.o.   MRN: [id number]  HPI   Review of Systems     Objective:   Physical Exam     Assessment:     ***    Plan:     ***    Err

## 2012-10-20 NOTE — Progress Notes (Signed)
This encounter was created in error - please disregard.

## 2012-12-13 ENCOUNTER — Ambulatory Visit (INDEPENDENT_AMBULATORY_CARE_PROVIDER_SITE_OTHER): Payer: Medicare Other | Admitting: Cardiovascular Disease

## 2012-12-13 ENCOUNTER — Encounter: Payer: Self-pay | Admitting: Cardiovascular Disease

## 2012-12-13 VITALS — BP 122/64 | HR 62 | Ht 64.0 in | Wt 150.8 lb

## 2012-12-13 DIAGNOSIS — Z951 Presence of aortocoronary bypass graft: Secondary | ICD-10-CM

## 2012-12-13 DIAGNOSIS — I2510 Atherosclerotic heart disease of native coronary artery without angina pectoris: Secondary | ICD-10-CM

## 2012-12-13 DIAGNOSIS — E785 Hyperlipidemia, unspecified: Secondary | ICD-10-CM

## 2012-12-13 DIAGNOSIS — I7090 Unspecified atherosclerosis: Secondary | ICD-10-CM

## 2012-12-13 DIAGNOSIS — I10 Essential (primary) hypertension: Secondary | ICD-10-CM

## 2012-12-13 NOTE — Progress Notes (Signed)
Patient ID: Brian Solomon, male   DOB: 1924/06/25, 79 y.o.   MRN: [id number]    SUBJECTIVE: Brian Solomon is an 79 y.o. gentleman with a PMH significant for hypertension and coronary artery disease. Since CABG surgery in 2004, he has done superbly with no cardiopulmonary symptoms and an excellent performance status. He continues to function at a high level without any significant health problems. Clopidogrel was added to his regime In 2009 following a catheterization which revealed moderate left main disease, atrophy of the LIMA graft and occlusion of a vein graft to the first marginal, but Dr. Dietrich Solomon discontinued it at his last visit. Patient was asymptomatic at the time but had a mildly abnormal stress test.   He has been married for 65 years, and they were high school sweethearts. He was born in [place] but moved to [place] in 1940. 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 He and his wife have 5 children, all of whom live in [place].  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  He denies chest pain and very seldom has shortness of breath, which he attributes to asthma. He denies palpitations, and his very mild leg swelling which is alleviated by keeping his legs up.    Filed Vitals:   12/13/12 1346  BP: 122/64  Pulse: 62  Height: 5\' 4"  (1.626 m)  Weight: 150 lb 12.8 oz (68.402 kg)     PHYSICAL EXAM General: NAD, does not appear his stated age. Neck: No JVD, no thyromegaly or thyroid nodule.  Lungs: Clear to auscultation bilaterally with normal respiratory effort. CV: Nondisplaced PMI.  Heart regular S1/S2, no S3/S4, no murmur.  No peripheral edema.  No carotid bruit.  Normal pedal pulses.  Abdomen: Soft, nontender, no hepatosplenomegaly, no distention.  Neurologic: Alert and oriented x 3.  Psych: Normal affect. Extremities: No clubbing or cyanosis.     LABS: Basic Metabolic Panel: No results  found for this basename: NA, K, CL, CO2, GLUCOSE, BUN, CREATININE, CALCIUM, MG, PHOS,  in the last 72 hours Liver Function Tests: No results found for this basename: AST, ALT, ALKPHOS, BILITOT, PROT, ALBUMIN,  in the last 72 hours No results found for this basename: LIPASE, AMYLASE,  in the last 72 hours CBC: No results found for this basename: WBC, NEUTROABS, HGB, HCT, MCV, PLT,  in the last 72 hours Cardiac Enzymes: No results found for this basename: CKTOTAL, CKMB, CKMBINDEX, TROPONINI,  in the last 72 hours BNP: No components found with this basename: POCBNP,  D-Dimer: No results found for this basename: DDIMER,  in the last 72 hours Hemoglobin A1C: No results found for this basename: HGBA1C,  in the last 72 hours Fasting Lipid Panel: No results found for this basename: CHOL, HDL, LDLCALC, TRIG, CHOLHDL, LDLDIRECT,  in the last 72 hours Thyroid Function Tests: No results found for this basename: TSH, T4TOTAL, FREET3, T3FREE, THYROIDAB,  in the last 72 hours Anemia Panel: No results found for this basename: VITAMINB12, FOLATE, FERRITIN, TIBC, IRON, RETICCTPCT,  in the last 72 hours  ECG: NSR, 62 bpm, old inferior MI    ASSESSMENT AND PLAN: 1. CAD s/p CABG: asymptomatic and stable. Continue present medication regimen.   Prentice Docker, M.D., F.A.C.C.

## 2012-12-13 NOTE — Patient Instructions (Addendum)
Your physician recommends that you schedule a follow-up appointment in: ONE YEAR 

## 2013-01-26 ENCOUNTER — Other Ambulatory Visit: Payer: Self-pay | Admitting: Cardiology

## 2013-08-29 ENCOUNTER — Telehealth: Payer: Self-pay | Admitting: *Deleted

## 2013-08-29 MED ORDER — ATORVASTATIN CALCIUM 40 MG PO TABS: ORAL_TABLET | ORAL | Status: DC

## 2013-08-29 NOTE — Telephone Encounter (Signed)
Pt usees [facility] in [place]  Needs atorvastatin called. We denide medication when pharmacy sent it to us because he needed follow up. Pt is not due for follow up till 11/2013  Pt has been out since friday

## 2013-08-29 NOTE — Telephone Encounter (Signed)
Medication sent via escribe. For Atorvastatin

## 2013-08-29 NOTE — Telephone Encounter (Signed)
[facility] [phone] f [phone]  Atorvastatin 40 mg #30

## 2013-08-29 NOTE — Telephone Encounter (Signed)
Refill rx as requested

## 2013-12-29 ENCOUNTER — Other Ambulatory Visit: Payer: Self-pay | Admitting: Adult Health

## 2013-12-29 ENCOUNTER — Other Ambulatory Visit: Payer: Self-pay | Admitting: Cardiovascular Disease

## 2014-03-20 ENCOUNTER — Ambulatory Visit (INDEPENDENT_AMBULATORY_CARE_PROVIDER_SITE_OTHER): Payer: Medicare Other | Admitting: Cardiovascular Disease

## 2014-03-20 ENCOUNTER — Encounter: Payer: Self-pay | Admitting: Cardiovascular Disease

## 2014-03-20 VITALS — BP 132/78 | HR 52 | Ht 64.0 in | Wt 148.0 lb

## 2014-03-20 DIAGNOSIS — Z951 Presence of aortocoronary bypass graft: Secondary | ICD-10-CM

## 2014-03-20 DIAGNOSIS — I10 Essential (primary) hypertension: Secondary | ICD-10-CM

## 2014-03-20 DIAGNOSIS — I2510 Atherosclerotic heart disease of native coronary artery without angina pectoris: Secondary | ICD-10-CM

## 2014-03-20 DIAGNOSIS — E785 Hyperlipidemia, unspecified: Secondary | ICD-10-CM

## 2014-03-20 NOTE — Progress Notes (Signed)
Patient ID: Brian Solomon, male   DOB: 1925-03-08, 80 y.o.   MRN: [id number]      SUBJECTIVE: The patient is an 80 year old male with a history of coronary artery disease and CABG as well as essential hypertension. The patient denies any symptoms of chest pain, palpitations, shortness of breath, lightheadedness, dizziness, leg swelling, orthopnea, PND, and syncope. He and his wife recently won a gift at church for being married the longest. ECG performed in the office today demonstrates normal sinus rhythm with old inferior infarct.   Soc: He has been married for 68 years, and they were high school sweethearts. He was born in [place] but moved to [place] in 1940. 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 He and his wife have 5 children, all of whom live in [place].  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   Review of Systems: As per "subjective", otherwise negative.  No Known Allergies  Current Outpatient Prescriptions  Medication Sig Dispense Refill  . aspirin 81 MG tablet Take 81 mg by mouth daily.        Marland Kitchen. atorvastatin (LIPITOR) 40 MG tablet TAKE (1) TABLET BY MOUTH ONCE DAILY.  30 tablet  3  . metoprolol tartrate (LOPRESSOR) 25 MG tablet TAKE (1/2) TABLET BY MOUTH TWICE DAILY.  30 tablet  0  . Tamsulosin HCl (FLOMAX) 0.4 MG CAPS Take 0.4 mg by mouth daily after supper.         No current facility-administered medications for this visit.    Past Medical History  Diagnosis Date  . Arteriosclerotic cardiovascular disease (ASCVD)     acute IMI in 2004 with subsequent CABG; cath 2009 60% left main; atretic LIMA; occluded SVG to M1; patent grafts to RCA and D1;  EF 40-45% with inferior hypokinesis on echo in 3/11; pharmacologic stress nuclear in 3/11-EF of 51%, mild basilar inferolateral ischemia  . Hyperlipidemia     Lipid profile in 2009:159, 57, 72, 76; 2012:176, 53, 76,  89  . Tobacco abuse, in remission     Quit in 1978  . Hypertension     CBC and CMet normal in 12/2010    Past Surgical History  Procedure Laterality Date  . Coronary artery bypass graft  2004    History   Social History  . Marital Status: Married    Spouse Name: N/A    Number of Children: 5  . Years of Education: N/A   Occupational History  . retired     [employer]   Social History Main Topics  . Smoking status: Former Smoker -- 0.50 packs/day for 4 years    Types: Cigarettes    Start date: 06/20/1942    Quit date: 12/22/1965  . Smokeless tobacco: Never Used  . Alcohol Use: No  . Drug Use: No  . Sexual Activity: Not on file   Other Topics Concern  . Not on file   Social History Narrative  . No narrative on file     Filed Vitals:   03/20/14 1428  BP: 132/78  Pulse: 52  Height: 5\' 4"  (1.626 m)  Weight: 148 lb (67.132 kg)    PHYSICAL EXAM General: NAD HEENT: Normal. Neck: No JVD, no thyromegaly. Lungs: Clear to auscultation bilaterally with normal respiratory effort. CV: Nondisplaced PMI.  Regular rate and rhythm, normal S1/S2, no S3/S4, no murmur. No pretibial or periankle edema.  No carotid bruit.  Normal pedal pulses.  Abdomen: Soft, nontender, no hepatosplenomegaly, no distention.  Neurologic: Alert and oriented x 3.  Psych: Normal affect. Skin: Normal. Musculoskeletal: Normal range of motion, no gross deformities. Extremities: No clubbing or cyanosis.   ECG: Most recent ECG reviewed.      ASSESSMENT AND PLAN: 1. CAD/CABG: Stable ischemic heart disease. Continue aspirin, Lipitor and metoprolol. 2. Essential HTN: Well controlled on current therapy which includes metoprolol. No medication changes.  Dispo: f/u 1 year.  Prentice DockerSuresh Viola Placeres, M.D., F.A.C.C.

## 2014-03-20 NOTE — Patient Instructions (Signed)
Your physician wants you to follow-up in: 1 year You will receive a reminder letter in the mail two months in advance. If you don't receive a letter, please call our office to schedule the follow-up appointment.    Your physician recommends that you continue on your current medications as directed. Please refer to the Current Medication list given to you today.     Thank you for choosing [facility] !  

## 2014-03-30 ENCOUNTER — Other Ambulatory Visit: Payer: Self-pay | Admitting: Cardiovascular Disease

## 2014-05-30 ENCOUNTER — Other Ambulatory Visit: Payer: Self-pay | Admitting: Adult Health

## 2015-01-16 ENCOUNTER — Encounter: Payer: Self-pay | Admitting: *Deleted

## 2015-02-06 ENCOUNTER — Encounter: Payer: Self-pay | Admitting: Internal Medicine

## 2015-03-23 DIAGNOSIS — E063 Autoimmune thyroiditis: Secondary | ICD-10-CM | POA: Diagnosis not present

## 2015-03-23 DIAGNOSIS — E782 Mixed hyperlipidemia: Secondary | ICD-10-CM | POA: Diagnosis not present

## 2015-03-23 DIAGNOSIS — Z0000 Encounter for general adult medical examination without abnormal findings: Secondary | ICD-10-CM | POA: Diagnosis not present

## 2015-03-23 DIAGNOSIS — Z1389 Encounter for screening for other disorder: Secondary | ICD-10-CM | POA: Diagnosis not present

## 2015-03-31 ENCOUNTER — Other Ambulatory Visit: Payer: Self-pay | Admitting: Cardiovascular Disease

## 2015-04-06 ENCOUNTER — Encounter: Payer: Self-pay | Admitting: Cardiovascular Disease

## 2015-04-06 ENCOUNTER — Ambulatory Visit (INDEPENDENT_AMBULATORY_CARE_PROVIDER_SITE_OTHER): Payer: Medicare Other | Admitting: Cardiovascular Disease

## 2015-04-06 VITALS — BP 118/72 | HR 86 | Ht 66.0 in | Wt 142.0 lb

## 2015-04-06 DIAGNOSIS — I10 Essential (primary) hypertension: Secondary | ICD-10-CM | POA: Diagnosis not present

## 2015-04-06 DIAGNOSIS — I25812 Atherosclerosis of bypass graft of coronary artery of transplanted heart without angina pectoris: Secondary | ICD-10-CM | POA: Diagnosis not present

## 2015-04-06 DIAGNOSIS — E785 Hyperlipidemia, unspecified: Secondary | ICD-10-CM

## 2015-04-06 NOTE — Patient Instructions (Signed)
Your physician wants you to follow-up in:  1 year with Dr Koneswaran You will receive a reminder letter in the mail two months in advance. If you don't receive a letter, please call our office to schedule the follow-up appointment.    Your physician recommends that you continue on your current medications as directed. Please refer to the Current Medication list given to you today.    If you need a refill on your cardiac medications before your next appointment, please call your pharmacy.     Thank you for choosing  [facility] !        

## 2015-04-06 NOTE — Progress Notes (Signed)
Patient ID: Brian Solomon, male   DOB: 1924/09/30, 81 y.o.   MRN: [id number]      SUBJECTIVE: The patient is an 81 year old male with a history of coronary artery disease and CABG as well as essential hypertension.  The patient denies any symptoms of chest pain, palpitations, shortness of breath, lightheadedness, dizziness, leg swelling, orthopnea, PND, and syncope. He and his wife recently won a gift at church for being married the longest.  He stays active and finds no reason to slow down.  A review of labs performed on 03/23/15 showed total cholesterol 176, triglycerides 58, HDL 82, LDL 82 as well. Hemoglobin 14.5, BUN 17, creatinine 1.13.  Soc: He has been married for 69 years, and they were high school sweethearts. He was born in [place] but moved to [place] in [place]. 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 He and his wife have 5 children, all of whom live in [place].  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   Review of Systems: As per "subjective", otherwise negative.  No Known Allergies  Current Outpatient Prescriptions  Medication Sig Dispense Refill  . aspirin 81 MG tablet Take 81 mg by mouth daily.      Marland Kitchen. atorvastatin (LIPITOR) 40 MG tablet TAKE (1) TABLET BY MOUTH ONCE DAILY. 30 tablet 0  . metoprolol tartrate (LOPRESSOR) 25 MG tablet TAKE (1/2) TABLET BY MOUTH TWICE DAILY. 30 tablet 11  . Tamsulosin HCl (FLOMAX) 0.4 MG CAPS Take 0.4 mg by mouth daily after supper.       No current facility-administered medications for this visit.    Past Medical History  Diagnosis Date  . Arteriosclerotic cardiovascular disease (ASCVD)     acute IMI in 2004 with subsequent CABG; cath 2009 60% left main; atretic LIMA; occluded SVG to M1; patent grafts to RCA and D1;  EF 40-45% with inferior hypokinesis on echo in 3/11; pharmacologic stress nuclear in 3/11-EF of 51%, mild  basilar inferolateral ischemia  . Hyperlipidemia     Lipid profile in 2009:159, 57, 72, 76; 2012:176, 53, 76, 89  . Tobacco abuse, in remission     Quit in 1978  . Hypertension     CBC and CMet normal in 12/2010  . History of myocardial perfusion scan 2009,2011    Past Surgical History  Procedure Laterality Date  . Coronary artery bypass graft  2004  . Cardiac catheterization      Social History   Social History  . Marital Status: Married    Spouse Name: N/A  . Number of Children: 5  . Years of Education: N/A   Occupational History  . retired     [employer]   Social History Main Topics  . Smoking status: Former Smoker -- 0.50 packs/day for 4 years    Types: Cigarettes    Start date: 06/20/1942    Quit date: 12/22/1965  . Smokeless tobacco: Never Used  . Alcohol Use: No  . Drug Use: No  . Sexual Activity: Not on file   Other Topics Concern  . Not on file   Social History Narrative     Filed Vitals:   04/06/15 1251  BP: 118/72  Pulse: 86  Height: 5\' 6"  (1.676 m)  Weight: 142 lb (64.411 kg)  SpO2: 94%    PHYSICAL EXAM General: NAD HEENT: Normal. Neck: No JVD, no thyromegaly. Lungs: Clear to auscultation bilaterally with  normal respiratory effort. CV: Nondisplaced PMI.  Regular rate and rhythm, normal S1/S2, no S3/S4, no murmur. No pretibial or periankle edema.  No carotid bruit.   Abdomen: Soft, nontender, no distention.  Neurologic: Alert and oriented x 3.  Psych: Normal affect. Skin: Normal. Musculoskeletal: Normal range of motion, no gross deformities. Extremities: No clubbing or cyanosis.   ECG: Most recent ECG reviewed.      ASSESSMENT AND PLAN: 1. CAD/CABG: Stable ischemic heart disease. Continue aspirin, Lipitor and metoprolol.  2. Essential HTN: Well controlled on current therapy which includes metoprolol. No medication changes.  3. Hyperlipidemia: Results noted above. Continue Lipitor at present dose.  Dispo: f/u 1  year.   Prentice Docker, M.D., F.A.C.C.

## 2015-04-30 ENCOUNTER — Other Ambulatory Visit: Payer: Self-pay | Admitting: Cardiovascular Disease

## 2015-05-03 ENCOUNTER — Ambulatory Visit (HOSPITAL_COMMUNITY): Payer: Medicare Other | Admitting: Oncology

## 2015-05-04 ENCOUNTER — Ambulatory Visit (HOSPITAL_COMMUNITY): Payer: Medicare Other | Admitting: Oncology

## 2015-05-04 ENCOUNTER — Telehealth (HOSPITAL_COMMUNITY): Payer: Self-pay | Admitting: Hematology & Oncology

## 2015-05-08 ENCOUNTER — Encounter (HOSPITAL_COMMUNITY): Payer: Self-pay

## 2015-05-14 DIAGNOSIS — H25811 Combined forms of age-related cataract, right eye: Secondary | ICD-10-CM | POA: Diagnosis not present

## 2015-09-10 DIAGNOSIS — E063 Autoimmune thyroiditis: Secondary | ICD-10-CM | POA: Diagnosis not present

## 2015-09-10 DIAGNOSIS — E781 Pure hyperglyceridemia: Secondary | ICD-10-CM | POA: Diagnosis not present

## 2015-09-10 DIAGNOSIS — Z6824 Body mass index (BMI) 24.0-24.9, adult: Secondary | ICD-10-CM | POA: Diagnosis not present

## 2015-09-10 DIAGNOSIS — I10 Essential (primary) hypertension: Secondary | ICD-10-CM | POA: Diagnosis not present

## 2015-09-10 DIAGNOSIS — Z1389 Encounter for screening for other disorder: Secondary | ICD-10-CM | POA: Diagnosis not present

## 2015-09-10 DIAGNOSIS — I2510 Atherosclerotic heart disease of native coronary artery without angina pectoris: Secondary | ICD-10-CM | POA: Diagnosis not present

## 2015-09-13 DIAGNOSIS — R7309 Other abnormal glucose: Secondary | ICD-10-CM | POA: Diagnosis not present

## 2015-10-03 DIAGNOSIS — Z719 Counseling, unspecified: Secondary | ICD-10-CM | POA: Diagnosis not present

## 2015-10-07 DIAGNOSIS — G4730 Sleep apnea, unspecified: Secondary | ICD-10-CM | POA: Diagnosis not present

## 2015-10-15 DIAGNOSIS — E781 Pure hyperglyceridemia: Secondary | ICD-10-CM | POA: Diagnosis not present

## 2015-10-15 DIAGNOSIS — R944 Abnormal results of kidney function studies: Secondary | ICD-10-CM | POA: Diagnosis not present

## 2015-10-15 DIAGNOSIS — R5383 Other fatigue: Secondary | ICD-10-CM | POA: Diagnosis not present

## 2015-10-27 ENCOUNTER — Other Ambulatory Visit: Payer: Self-pay | Admitting: Cardiovascular Disease

## 2015-11-13 DIAGNOSIS — H2511 Age-related nuclear cataract, right eye: Secondary | ICD-10-CM | POA: Diagnosis not present

## 2015-11-13 DIAGNOSIS — Z961 Presence of intraocular lens: Secondary | ICD-10-CM | POA: Diagnosis not present

## 2015-11-30 ENCOUNTER — Other Ambulatory Visit: Payer: Self-pay | Admitting: Cardiovascular Disease

## 2015-11-30 NOTE — Telephone Encounter (Signed)
Refill complete 

## 2015-12-03 NOTE — Patient Instructions (Signed)
Brian Solomon  12/03/2015     @PREFPERIOPPHARMACY @   Your procedure is scheduled on 12/11/2015.  [facility] at [facility].  Call this number if you have problems the morning of surgery:  [phone]   Remember:  Do not eat food or drink liquids after midnight.  Take these medicines the morning of surgery with A SIP OF WATER Losartan, Metoprolol, Flomax   Do not wear jewelry, make-up or nail polish.  Do not wear lotions, powders, or perfumes.  You may wear deoderant.  Do not shave 48 hours prior to surgery.  Men may shave face and neck.  Do not bring valuables to the hospital.  [facility] is not responsible for any belongings or valuables.  Contacts, dentures or bridgework may not be worn into surgery.  Leave your suitcase in the car.  After surgery it may be brought to your room.  For patients admitted to the hospital, discharge time will be determined by your treatment team.  Patients discharged the day of surgery will not be allowed to drive home.   Please read over the following fact sheets that you were given. Anesthesia Post-op Instructions   PATIENT INSTRUCTIONS POST-ANESTHESIA  IMMEDIATELY FOLLOWING SURGERY:  Do not drive or operate machinery for the first twenty four hours after surgery.  Do not make any important decisions for twenty four hours after surgery or while taking narcotic pain medications or sedatives.  If you develop intractable nausea and vomiting or a severe headache please notify your doctor immediately.  FOLLOW-UP:  Please make an appointment with your surgeon as instructed. You do not need to follow up with anesthesia unless specifically instructed to do so.  WOUND CARE INSTRUCTIONS (if applicable):  Keep a dry clean dressing on the anesthesia/puncture wound site if there is drainage.  Once the wound has quit draining you may leave it open to air.  Generally you should leave the bandage intact for twenty four hours unless there is drainage.   If the epidural site drains for more than 36-48 hours please call the anesthesia department.  QUESTIONS?:  Please feel free to call your physician or the hospital operator if you have any questions, and they will be happy to assist you.       A cataract is a clouding of the lens of the eye. When a lens becomes cloudy, vision is reduced based on the degree and nature of the clouding. Surgery may be needed to improve vision. Surgery removes the cloudy lens and usually replaces it with a substitute lens (intraocular lens, IOL). LET YOUR EYE DOCTOR KNOW ABOUT:  Allergies to food or medicine.  Medicines taken including herbs, eye drops, over-the-counter medicines, and creams.  Use of steroids (by mouth or creams).  Previous problems with anesthetics or numbing medicine.  History of bleeding problems or blood clots.  Previous surgery.  Other health problems, including diabetes and kidney problems.  Possibility of pregnancy, if this applies. RISKS AND COMPLICATIONS  Infection.  Inflammation of the eyeball (endophthalmitis) that can spread to both eyes (sympathetic ophthalmia).  Poor wound healing.  If an IOL is inserted, it can later fall out of proper position. This is very uncommon.  Clouding of the part of your eye that holds an IOL in place. This is called an "after-cataract." These are uncommon but easily treated. BEFORE THE PROCEDURE  Do not eat or drink anything except small amounts of water for 8 to 12 before your surgery, or as  directed by your caregiver.  Unless you are told otherwise, continue any eye drops you have been prescribed.  Talk to your primary caregiver about all other medicines that you take (both prescription and nonprescription). In some cases, you may need to stop or change medicines near the time of your surgery. This is most important if you are taking blood-thinning medicine.Do not stop medicines unless you are told to do so.  Arrange for someone to  drive you to and from the procedure.  Do not put contact lenses in either eye on the day of your surgery. PROCEDURE There is more than one method for safely removing a cataract. Your doctor can explain the differences and help determine which is best for you. Phacoemulsification surgery is the most common form of cataract surgery.  An injection is given behind the eye or eye drops are given to make this a painless procedure.  A small cut (incision) is made on the edge of the clear, dome-shaped surface that covers the front of the eye (cornea).  A tiny probe is painlessly inserted into the eye. This device gives off ultrasound waves that soften and break up the cloudy center of the lens. This makes it easier for the cloudy lens to be removed by suction.  An IOL may be implanted.  The normal lens of the eye is covered by a clear capsule. Part of that capsule is intentionally left in the eye to support the IOL.  Your surgeon may or may not use stitches to close the incision. There are other forms of cataract surgery that require a larger incision and stitches to close the eye. This approach is taken in cases where the doctor feels that the cataract cannot be easily removed using phacoemulsification. AFTER THE PROCEDURE  When an IOL is implanted, it does not need care. It becomes a permanent part of your eye and cannot be seen or felt.  Your doctor will schedule follow-up exams to check on your progress.  Review your other medicines with your doctor to see which can be resumed after surgery.  Use eye drops or take medicine as prescribed by your doctor.   This information is not intended to replace advice given to you by your health care provider. Make sure you discuss any questions you have with your health care provider.   Document Released: 05/08/2011 Document Revised: 06/09/2014 Document Reviewed: 05/08/2011 Elsevier Interactive Patient Education [employer]2016 [employer] Inc.

## 2015-12-05 ENCOUNTER — Encounter (HOSPITAL_COMMUNITY)
Admission: RE | Admit: 2015-12-05 | Discharge: 2015-12-05 | Disposition: A | Discharge status: Home / Self Care | Payer: Medicare Other | Source: Ambulatory Visit | Attending: Ophthalmology | Admitting: Ophthalmology

## 2015-12-05 ENCOUNTER — Encounter (HOSPITAL_COMMUNITY): Payer: Self-pay

## 2015-12-05 DIAGNOSIS — Z01812 Encounter for preprocedural laboratory examination: Secondary | ICD-10-CM | POA: Insufficient documentation

## 2015-12-05 LAB — CBC
HCT: 39.8 % (ref 39.0–52.0)
HEMOGLOBIN: 13.5 g/dL (ref 13.0–17.0)
MCH: 33.8 pg (ref 26.0–34.0)
MCHC: 33.9 g/dL (ref 30.0–36.0)
MCV: 99.7 fL (ref 78.0–100.0)
Platelets: 145 10*3/uL — ABNORMAL LOW (ref 150–400)
RBC: 3.99 MIL/uL — ABNORMAL LOW (ref 4.22–5.81)
RDW: 15.3 % (ref 11.5–15.5)
WBC: 4.8 10*3/uL (ref 4.0–10.5)

## 2015-12-05 LAB — BASIC METABOLIC PANEL
Anion gap: 7 (ref 5–15)
BUN: 21 mg/dL — ABNORMAL HIGH (ref 6–20)
CALCIUM: 8.8 mg/dL — AB (ref 8.9–10.3)
CO2: 25 mmol/L (ref 22–32)
Chloride: 105 mmol/L (ref 101–111)
Creatinine, Ser: 1.03 mg/dL (ref 0.61–1.24)
GFR calc non Af Amer: >60 mL/min (ref >60)
Glucose, Bld: 97 mg/dL (ref 65–99)
Potassium: 4.7 mmol/L (ref 3.5–5.1)
SODIUM: 137 mmol/L (ref 135–145)

## 2015-12-11 ENCOUNTER — Ambulatory Visit (HOSPITAL_COMMUNITY): Payer: Medicare Other | Admitting: Anesthesiology

## 2015-12-11 ENCOUNTER — Ambulatory Visit (HOSPITAL_COMMUNITY)
Admission: RE | Admit: 2015-12-11 | Discharge: 2015-12-11 | Disposition: A | Discharge status: Home / Self Care | Payer: Medicare Other | Source: Ambulatory Visit | Attending: Ophthalmology | Admitting: Ophthalmology

## 2015-12-11 ENCOUNTER — Encounter (HOSPITAL_COMMUNITY)
Admission: RE | Disposition: A | Discharge status: Home / Self Care | Payer: Self-pay | Source: Ambulatory Visit | Attending: Ophthalmology

## 2015-12-11 ENCOUNTER — Encounter (HOSPITAL_COMMUNITY): Payer: Self-pay | Admitting: *Deleted

## 2015-12-11 DIAGNOSIS — I2510 Atherosclerotic heart disease of native coronary artery without angina pectoris: Secondary | ICD-10-CM | POA: Insufficient documentation

## 2015-12-11 DIAGNOSIS — H25011 Cortical age-related cataract, right eye: Secondary | ICD-10-CM | POA: Diagnosis not present

## 2015-12-11 DIAGNOSIS — I252 Old myocardial infarction: Secondary | ICD-10-CM | POA: Diagnosis not present

## 2015-12-11 DIAGNOSIS — H2511 Age-related nuclear cataract, right eye: Secondary | ICD-10-CM | POA: Insufficient documentation

## 2015-12-11 DIAGNOSIS — H269 Unspecified cataract: Secondary | ICD-10-CM | POA: Diagnosis not present

## 2015-12-11 DIAGNOSIS — Z79899 Other long term (current) drug therapy: Secondary | ICD-10-CM | POA: Diagnosis not present

## 2015-12-11 DIAGNOSIS — Z87891 Personal history of nicotine dependence: Secondary | ICD-10-CM | POA: Diagnosis not present

## 2015-12-11 DIAGNOSIS — I10 Essential (primary) hypertension: Secondary | ICD-10-CM | POA: Diagnosis not present

## 2015-12-11 DIAGNOSIS — Z7982 Long term (current) use of aspirin: Secondary | ICD-10-CM | POA: Insufficient documentation

## 2015-12-11 DIAGNOSIS — J45909 Unspecified asthma, uncomplicated: Secondary | ICD-10-CM | POA: Insufficient documentation

## 2015-12-11 HISTORY — PX: CATARACT EXTRACTION W/PHACO: SHX586

## 2015-12-11 SURGERY — PHACOEMULSIFICATION, CATARACT, WITH IOL INSERTION
Anesthesia: Monitor Anesthesia Care | Site: Eye | Laterality: Right

## 2015-12-11 MED ORDER — EPINEPHRINE HCL 1 MG/ML IJ SOLN
INTRAOCULAR | Status: DC | PRN
  Administered 2015-12-11: 500 mL

## 2015-12-11 MED ORDER — EPINEPHRINE HCL 1 MG/ML IJ SOLN
INTRAMUSCULAR | Status: AC
  Filled 2015-12-11: qty 1

## 2015-12-11 MED ORDER — PROVISC 10 MG/ML IO SOLN
INTRAOCULAR | Status: DC | PRN
  Administered 2015-12-11: 0.85 mL via INTRAOCULAR

## 2015-12-11 MED ORDER — TETRACAINE HCL 0.5 % OP SOLN
1.0000 [drp] | OPHTHALMIC | Status: AC
  Administered 2015-12-11 (×3): 1 [drp] via OPHTHALMIC

## 2015-12-11 MED ORDER — PHENYLEPHRINE HCL 2.5 % OP SOLN
1.0000 [drp] | OPHTHALMIC | Status: AC
  Administered 2015-12-11 (×3): 1 [drp] via OPHTHALMIC

## 2015-12-11 MED ORDER — FENTANYL CITRATE (PF) 100 MCG/2ML IJ SOLN
25.0000 ug | INTRAMUSCULAR | Status: DC | PRN
  Administered 2015-12-11: 25 ug via INTRAVENOUS

## 2015-12-11 MED ORDER — MIDAZOLAM HCL 2 MG/2ML IJ SOLN
INTRAMUSCULAR | Status: AC
  Filled 2015-12-11: qty 2

## 2015-12-11 MED ORDER — CYCLOPENTOLATE-PHENYLEPHRINE 0.2-1 % OP SOLN
1.0000 [drp] | OPHTHALMIC | Status: AC
  Administered 2015-12-11 (×3): 1 [drp] via OPHTHALMIC

## 2015-12-11 MED ORDER — KETOROLAC TROMETHAMINE 0.5 % OP SOLN
1.0000 [drp] | OPHTHALMIC | Status: AC
  Administered 2015-12-11 (×3): 1 [drp] via OPHTHALMIC

## 2015-12-11 MED ORDER — BSS IO SOLN
INTRAOCULAR | Status: DC | PRN
  Administered 2015-12-11: 15 mL

## 2015-12-11 MED ORDER — FENTANYL CITRATE (PF) 100 MCG/2ML IJ SOLN
INTRAMUSCULAR | Status: AC
  Filled 2015-12-11: qty 2

## 2015-12-11 MED ORDER — LACTATED RINGERS IV SOLN
INTRAVENOUS | Status: DC
  Administered 2015-12-11 (×2): via INTRAVENOUS

## 2015-12-11 MED ORDER — MIDAZOLAM HCL 2 MG/2ML IJ SOLN
1.0000 mg | INTRAMUSCULAR | Status: DC | PRN
  Administered 2015-12-11: 1 mg via INTRAVENOUS

## 2015-12-11 SURGICAL SUPPLY — 10 items
CLOTH BEACON ORANGE TIMEOUT ST (SAFETY) ×2 IMPLANT
EYE SHIELD UNIVERSAL CLEAR (GAUZE/BANDAGES/DRESSINGS) ×3 IMPLANT
GLOVE BIO SURGEON STRL SZ 6.5 (GLOVE) ×1 IMPLANT
GLOVE BIO SURGEONS STRL SZ 6.5 (GLOVE) ×1
GLOVE EXAM NITRILE MD LF STRL (GLOVE) ×2 IMPLANT
LENS ALC ACRYL/TECN (Ophthalmic Related) ×3 IMPLANT
PAD ARMBOARD 7.5X6 YLW CONV (MISCELLANEOUS) ×2 IMPLANT
TAPE SURG TRANSPORE 1 IN (GAUZE/BANDAGES/DRESSINGS) ×1 IMPLANT
TAPE SURGICAL TRANSPORE 1 IN (GAUZE/BANDAGES/DRESSINGS) ×2
WATER STERILE IRR 250ML POUR (IV SOLUTION) ×2 IMPLANT

## 2015-12-11 NOTE — Anesthesia Preprocedure Evaluation (Signed)
Anesthesia Evaluation  Patient identified by MRN, date of birth, ID band Patient awake    Reviewed: reviewed documented beta blocker date and time   Airway Mallampati: II  TM Distance: >3 FB     Dental  (+) Partial Upper   Pulmonary former smoker,    breath sounds clear to auscultation       Cardiovascular hypertension, Pt. on home beta blockers and Pt. on medications (-) angina+ CAD and + Past MI   Rhythm:Regular Rate:Normal     Neuro/Psych    GI/Hepatic negative GI ROS,   Endo/Other    Renal/GU      Musculoskeletal   Abdominal   Peds  Hematology   Anesthesia Other Findings   Reproductive/Obstetrics                             Anesthesia Physical Anesthesia Plan  ASA: III  Anesthesia Plan: MAC   Post-op Pain Management:    Induction: Intravenous  Airway Management Planned: Nasal Cannula  Additional Equipment:   Intra-op Plan:   Post-operative Plan:   Informed Consent: I have reviewed the patients History and Physical, chart, labs and discussed the procedure including the risks, benefits and alternatives for the proposed anesthesia with the patient or authorized representative who has indicated his/her understanding and acceptance.     Plan Discussed with:   Anesthesia Plan Comments:         Anesthesia Quick Evaluation

## 2015-12-11 NOTE — Anesthesia Postprocedure Evaluation (Signed)
Anesthesia Post Note  Patient: Brian Solomon  Procedure(s) Performed: Procedure(s) (LRB): CATARACT EXTRACTION PHACO AND INTRAOCULAR LENS PLACEMENT (IOC) (Right)  Patient location during evaluation: PACU Level of consciousness: awake and alert and oriented Pain management: pain level controlled Vital Signs Assessment: post-procedure vital signs reviewed and stable Respiratory status: spontaneous breathing Cardiovascular status: blood pressure returned to baseline, stable and bradycardic Postop Assessment: no headache, no backache, no signs of nausea or vomiting and adequate PO intake    Last Vitals:  Filed Vitals:   12/11/15 0750 12/11/15 0816  BP: 138/80 158/78  Pulse:  47  Temp:  36.4 C  Resp: 11 16    Last Pain: There were no vitals filed for this visit.               Tsutomu Barfoot

## 2015-12-11 NOTE — Op Note (Signed)
Patient brought to the operating room and prepped and draped in the usual manner.  Lid speculum inserted in right eye.  Stab incision made at the twelve o'clock position.  Provisc instilled in the anterior chamber.   A 2.4 mm. Stab incision was made temporally.  An anterior capsulotomy was done with a bent 25 gauge needle.  The nucleus was hydrodissected.  The Phaco tip was inserted in the anterior chamber and the nucleus was emulsified.  CDE was 9.44.  The cortical material was then removed with the I and A tip.  Posterior capsule was the polished.  The anterior chamber was deepened with Provisc.  A 21.0 Diopter Alcon [id number] IOL was then inserted in the capsular bag.  Provisc was then removed with the I and A tip.  The wound was then hydrated.  Patient sent to the Recovery Room in good condition with follow up in my office.  Preoperative Diagnosis:  Cortical and Nuclear Cataract OD Postoperative Diagnosis:  Same Procedure name: Kelman Phacoemulsification OD with IOL

## 2015-12-11 NOTE — Discharge Instructions (Signed)
°  °          [facility] Instructions °[address]- [place] [address] °    ° °1. Avoid closing eyes tightly. One often closes the eye tightly when laughing, talking, sneezing, coughing or if they feel irritated. At these times, you should be careful not to close your eyes tightly. ° °2. Instill eye drops as instructed. To instill drops in your eye, open it, look up and have someone gently pull the lower lid down and instill a couple of drops inside the lower lid. ° °3. Do not touch upper lid. ° °4. Take Advil or Tylenol for pain. ° °5. You may use either eye for near work, such as reading or sewing and you may watch television. ° °6. You may have your hair done at the beauty parlor at any time. ° °7. Wear dark glasses with or without your own glasses if you are in bright light. ° °8. Call our office at [phone] or [phone] if you have sharp pain in your eye or unusual symptoms. ° °9.  FOLLOW UP WITH DR. SHAPIRO TODAY IN HIS [place] OFFICE AT 2:45pm. ° °  °I have received a copy of the above instructions and will follow them.  ° ° ° °IF YOU ARE IN IMMEDIATE DANGER CALL 911! ° °It is important for you to keep your follow-up appointment with your physician after discharge, OR, for you /your caregiver to make a follow-up appointment with your physician / medical provider after discharge. ° °Show these instructions to the next healthcare provider you see. ° °

## 2015-12-11 NOTE — H&P (Signed)
The patient was re examined and there is no change in the patients condition since the original H and P. 

## 2015-12-11 NOTE — Transfer of Care (Signed)
Immediate Anesthesia Transfer of Care Note  Patient: Brian Solomon  Procedure(s) Performed: Procedure(s) with comments: CATARACT EXTRACTION PHACO AND INTRAOCULAR LENS PLACEMENT (IOC) (Right) - CDE: 9.44  Patient Location: PACU  Anesthesia Type:MAC  Level of Consciousness: awake, alert , oriented and patient cooperative  Airway & Oxygen Therapy: Patient Spontanous Breathing  Post-op Assessment: Report given to RN and Post -op Vital signs reviewed and stable  Post vital signs: Reviewed and stable  Last Vitals:  Filed Vitals:   12/11/15 0750 12/11/15 0816  BP: 138/80 158/78  Pulse:  47  Temp:  36.4 C  Resp: 11 16    Last Pain: There were no vitals filed for this visit.    Patients Stated Pain Goal: 5 (12/11/15 0704)  Complications: No apparent anesthesia complications

## 2015-12-13 ENCOUNTER — Encounter (HOSPITAL_COMMUNITY): Payer: Self-pay | Admitting: Ophthalmology

## 2015-12-18 ENCOUNTER — Encounter: Payer: Self-pay | Admitting: Adult Health

## 2015-12-18 ENCOUNTER — Ambulatory Visit (INDEPENDENT_AMBULATORY_CARE_PROVIDER_SITE_OTHER): Payer: Medicare Other | Admitting: Adult Health

## 2015-12-18 VITALS — BP 140/76 | HR 73 | Ht 64.0 in | Wt 143.0 lb

## 2015-12-18 DIAGNOSIS — I10 Essential (primary) hypertension: Secondary | ICD-10-CM | POA: Diagnosis not present

## 2015-12-18 DIAGNOSIS — I2510 Atherosclerotic heart disease of native coronary artery without angina pectoris: Secondary | ICD-10-CM

## 2015-12-18 DIAGNOSIS — R55 Syncope and collapse: Secondary | ICD-10-CM | POA: Diagnosis not present

## 2015-12-18 NOTE — Progress Notes (Signed)
Cardiology Office Note   Date:  12/18/2015   ID:  Brian Solomon, DOB [birth year], MRN [id number]  PCP:  Cassell Smiles., MD  Cardiologist: Joni Reining, NP Brian Solomon  No chief complaint on file.     History of Present Illness: Brian Solomon is a 82 y.o. male who presents for ongoing assessment and management of CAD, hx of CABG and hypertension. He was seen by Dr. Purvis Solomon in 04/2015 and was stable from cardiology standpoint. He was to be seen again in one year.   He comes today with a near syncopal episode. He states about 6 years ago he had something similar when driving. He saw cardiologist and was placed on metoprolol. He had not further symptoms until 3 days ago. He was sitting in the mall,waiting for his wife to finish shopping. States he sat there for one hour before she returned. When he stood up, he felt a gray blackness come over his eyes. This lasted seconds and went away on its own. He felt a little weak. He denies chest pain, racing HR or dyspnea. It concerned him enough to have it checked out.    Past Medical History  Diagnosis Date  . Arteriosclerotic cardiovascular disease (ASCVD)     acute IMI in 2004 with subsequent CABG; cath 2009 60% left main; atretic LIMA; occluded SVG to M1; patent grafts to RCA and D1;  EF 40-45% with inferior hypokinesis on echo in 3/11; pharmacologic stress nuclear in 3/11-EF of 51%, mild basilar inferolateral ischemia  . Hyperlipidemia     Lipid profile in 2009:159, 57, 72, 76; 2012:176, 53, 76, 89  . Tobacco abuse, in remission     Quit in 1978  . Hypertension     CBC and CMet normal in 12/2010  . History of myocardial perfusion scan 2009,2011    Past Surgical History  Procedure Laterality Date  . Coronary artery bypass graft  2004  . Cardiac catheterization    . Hernia repair    . Cataract extraction w/phaco Right 12/11/2015    Procedure: CATARACT EXTRACTION PHACO AND INTRAOCULAR LENS PLACEMENT (IOC);  Surgeon: Jethro Bolus, MD;  Location: [facility];  Service: Ophthalmology;  Laterality: Right;  CDE: 9.44     Current Outpatient Prescriptions  Medication Sig Dispense Refill  . aspirin 81 MG tablet Take 81 mg by mouth daily.      Marland Kitchen atorvastatin (LIPITOR) 40 MG tablet TAKE (1) TABLET BY MOUTH ONCE DAILY. 30 tablet 3  . losartan (COZAAR) 50 MG tablet Take 50 mg by mouth daily.    . metoprolol tartrate (LOPRESSOR) 25 MG tablet TAKE (1/2) TABLET BY MOUTH 2 TIMES A DAY. 30 tablet 6  . Tamsulosin HCl (FLOMAX) 0.4 MG CAPS Take 0.4 mg by mouth daily after supper.       No current facility-administered medications for this visit.    Allergies:   Review of patient's allergies indicates no known allergies.    Social History:  The patient  reports that he quit smoking about 50 years ago. His smoking use included Cigarettes. He started smoking about 73 years ago. He has a 2 pack-year smoking history. He has never used smokeless tobacco. He reports that he does not drink alcohol or use illicit drugs.   Family History:  The patient's family history includes Heart disease in his father; Stroke in his mother.    ROS: All other systems are reviewed and negative. Unless otherwise mentioned in H&P    PHYSICAL EXAM: VS:  There  were no vitals taken for this visit. , BMI There is no weight on file to calculate BMI. GEN: Well nourished, well developed, in no acute distress HEENT: normal Neck: no JVD, no carotid bruits, or masses Cardiac:RRR; no murmurs, rubs, or gallops,no edema  Respiratory:  clear to auscultation bilaterally, normal work of breathing GI: soft, nontender, nondistended, + BS MS: no deformity or atrophy Skin: warm and dry, no rash Neuro:  Strength and sensation are intact Psych: euthymic mood, full affect   EKG: The ekg ordered today demonstrates sinus bradycardia, rate of 54 bpm. T-wave flattening in the lateral leads.    Recent Labs: 12/05/2015: BUN 21*; Creatinine, Ser 1.03; Hemoglobin 13.5;  Platelets 145*; Potassium 4.7; Sodium 137    Lipid Panel    Component Value Date/Time   CHOL 174 03/04/2012 0821   TRIG 58 03/04/2012 0821   HDL 71 03/04/2012 0821   CHOLHDL 2.5 03/04/2012 0821   VLDL 12 03/04/2012 0821   LDLCALC 91 03/04/2012 0821      Wt Readings from Last 3 Encounters:  12/11/15 144 lb (65.318 kg)  12/05/15 144 lb (65.318 kg)  04/06/15 142 lb (64.411 kg)      ASSESSMENT AND PLAN:  1.  Near syncope: Lasted for a few seconds after siting for an hour. No associated symptoms. I have had orthostatic BP's completed.     BP  Lying 138/72 HR 61\     BP Sitting 142/72 HR 55     BP Standing 138/78 HR 64     BP Standing (3 mins) 160/90 HR 61.   I will not make any changes on medications at this time. He states he could "feel it" but was not dizzy with changes in position. I have advised him to make sure he eats and drinks, does not go without a meal and keeps hydrated. IF this happens again, may have him wear a cardiac monitor.  He is also on Flomax that may cause temporary symptoms. He will be seen in 2 months.   2. CAD: Continues on low dose BB, 12.5 mg daily and ARB. No changes in medications at this time.    Current medicines are reviewed at length with the patient today.    Labs/ tests ordered today include: I have reviewed labs completed last week.  No orders of the defined types were placed in this encounter.     Disposition:   FU with 2 months.  Signed, Joni ReiningKathryn Barbara Keng, NP  12/18/2015 7:51 AM    [facility] [address], [place], [place] [address] Phone: [phone]; Fax: [phone]

## 2015-12-18 NOTE — Patient Instructions (Signed)
Medication Instructions:  Your physician recommends that you continue on your current medications as directed. Please refer to the Current Medication list given to you today.   Labwork: NONE  Testing/Procedures: NONE  Follow-Up: Your physician recommends that you schedule a follow-up appointment in: 2 MONTHS    Any Other Special Instructions Will Be Listed Below (If Applicable).     If you need a refill on your cardiac medications before your next appointment, please call your pharmacy.   

## 2015-12-18 NOTE — Progress Notes (Signed)
Name: Brian Solomon    DOB: [birth year]  Age: 82 y.o.  MR#: [id number]       PCP:  Cassell SmilesFUSCO,LAWRENCE J., MD      Insurance: Payor: [employer] MEDICARE / Plan: [facility] / Product Type: *No Product type* /   CC:   No chief complaint on file.   VS Filed Vitals:   12/18/15 1545  BP: 140/76  Pulse: 73  Height: 5\' 4"  (1.626 m)  Weight: 143 lb (64.864 kg)  SpO2: 97%    Weights Current Weight  12/18/15 143 lb (64.864 kg)  12/11/15 144 lb (65.318 kg)  12/05/15 144 lb (65.318 kg)    Blood Pressure  BP Readings from Last 3 Encounters:  12/18/15 140/76  12/11/15 158/78  12/05/15 158/74     Admit date:  (Not on file) Last encounter with RMR:  Visit date not found   Allergy Review of patient's allergies indicates no known allergies.  Current Outpatient Prescriptions  Medication Sig Dispense Refill  . aspirin 81 MG tablet Take 81 mg by mouth daily.      Marland Kitchen. atorvastatin (LIPITOR) 40 MG tablet TAKE (1) TABLET BY MOUTH ONCE DAILY. 30 tablet 3  . losartan (COZAAR) 50 MG tablet Take 50 mg by mouth daily.    . metoprolol tartrate (LOPRESSOR) 25 MG tablet TAKE (1/2) TABLET BY MOUTH 2 TIMES A DAY. 30 tablet 6  . Tamsulosin HCl (FLOMAX) 0.4 MG CAPS Take 0.4 mg by mouth daily after supper.       No current facility-administered medications for this visit.    Discontinued Meds:   There are no discontinued medications.  Patient Active Problem List   Diagnosis Date Noted  . Arteriosclerotic cardiovascular disease (ASCVD)   . Hyperlipidemia   . Tobacco abuse, in remission   . Hypertension     LABS    Component Value Date/Time   NA 137 12/05/2015 1115   K 4.7 12/05/2015 1115   CL 105 12/05/2015 1115   CO2 25 12/05/2015 1115   GLUCOSE 97 12/05/2015 1115   BUN 21* 12/05/2015 1115   CREATININE 1.03 12/05/2015 1115   CALCIUM 8.8* 12/05/2015 1115   GFRNONAA >60 12/05/2015 1115   GFRAA >60 12/05/2015 1115   CMP     Component Value Date/Time   NA 137 12/05/2015 1115   K  4.7 12/05/2015 1115   CL 105 12/05/2015 1115   CO2 25 12/05/2015 1115   GLUCOSE 97 12/05/2015 1115   BUN 21* 12/05/2015 1115   CREATININE 1.03 12/05/2015 1115   CALCIUM 8.8* 12/05/2015 1115   GFRNONAA >60 12/05/2015 1115   GFRAA >60 12/05/2015 1115       Component Value Date/Time   WBC 4.8 12/05/2015 1115   HGB 13.5 12/05/2015 1115   HCT 39.8 12/05/2015 1115   MCV 99.7 12/05/2015 1115    Lipid Panel     Component Value Date/Time   CHOL 174 03/04/2012 0821   TRIG 58 03/04/2012 0821   HDL 71 03/04/2012 0821   CHOLHDL 2.5 03/04/2012 0821   VLDL 12 03/04/2012 0821   LDLCALC 91 03/04/2012 0821    ABG No results found for: PHART, PCO2ART, PO2ART, HCO3, TCO2, ACIDBASEDEF, O2SAT   No results found for: TSH BNP (last 3 results) No results for input(s): BNP in the last 8760 hours.  ProBNP (last 3 results) No results for input(s): PROBNP in the last 8760 hours.  Cardiac Panel (last 3 results) No results for input(s): CKTOTAL, CKMB, TROPONINI, RELINDX  in the last 72 hours.  Iron/TIBC/Ferritin/ %Sat No results found for: IRON, TIBC, FERRITIN, IRONPCTSAT   EKG Orders placed or performed in visit on 12/18/15  . EKG 12-Lead     Prior Assessment and Plan Problem List as of 12/18/2015      Cardiovascular and Mediastinum   Arteriosclerotic cardiovascular disease (ASCVD)   Last Assessment & Plan 12/15/2011 Office Visit Edited 12/18/2011  8:29 PM by Kathlen Brunswick, MD    Patient continues to do astoundingly well despite a less than optimal outcome from CABG surgery and mild asymptomatic stress-induced ischemia.  Optimal medical management will be continued.  Clopidogrel was added empirically, likely is not providing significant benefit, and thus will be discontinued.      Hypertension   Last Assessment & Plan 12/15/2011 Office Visit Edited 12/18/2011  8:31 PM by Kathlen Brunswick, MD    Patient's blood pressure was mildly elevated at his last visit with [facility]; however,  he does not recall the actual value.  He does not measure blood pressure at home, but when assessed in other venues, systolics are typically less than 140.  At this point, control of hypertension appears adequate with current therapy, which is modest and will be continued.  Appropriate laboratory studies are pending.  I will reassess this nice gentleman again next year.         Other   Hyperlipidemia   Last Assessment & Plan 12/15/2011 Office Visit Written 12/15/2011  2:46 PM by Kathlen Brunswick, MD    Lipid profile will be repeated and therapy adjusted accordingly.      Tobacco abuse, in remission       Imaging: No results found.

## 2016-01-16 DIAGNOSIS — Z961 Presence of intraocular lens: Secondary | ICD-10-CM | POA: Diagnosis not present

## 2016-02-22 ENCOUNTER — Encounter: Payer: Self-pay | Admitting: Cardiovascular Disease

## 2016-02-22 ENCOUNTER — Ambulatory Visit (INDEPENDENT_AMBULATORY_CARE_PROVIDER_SITE_OTHER): Payer: Medicare Other | Admitting: Cardiovascular Disease

## 2016-02-22 ENCOUNTER — Other Ambulatory Visit (HOSPITAL_COMMUNITY): Payer: Self-pay | Admitting: Pharmacist

## 2016-02-22 VITALS — BP 126/72 | HR 68 | Ht 60.0 in | Wt 143.0 lb

## 2016-02-22 DIAGNOSIS — R55 Syncope and collapse: Secondary | ICD-10-CM | POA: Diagnosis not present

## 2016-02-22 DIAGNOSIS — I10 Essential (primary) hypertension: Secondary | ICD-10-CM

## 2016-02-22 DIAGNOSIS — E785 Hyperlipidemia, unspecified: Secondary | ICD-10-CM

## 2016-02-22 DIAGNOSIS — I25812 Atherosclerosis of bypass graft of coronary artery of transplanted heart without angina pectoris: Secondary | ICD-10-CM

## 2016-02-22 NOTE — Patient Instructions (Signed)
Your physician wants you to follow-up in:  1 year with Dr Koneswaran You will receive a reminder letter in the mail two months in advance. If you don't receive a letter, please call our office to schedule the follow-up appointment.    Your physician recommends that you continue on your current medications as directed. Please refer to the Current Medication list given to you today.    If you need a refill on your cardiac medications before your next appointment, please call your pharmacy.     Thank you for choosing [facility] !        

## 2016-02-22 NOTE — Progress Notes (Signed)
SUBJECTIVE: The patient is an 82 year old male with a history of coronary artery disease and CABG as well as essential hypertension.  Had a near syncopal episode in July 2017 which occurred after he stood up too quickly after sitting. 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  No recurrences.  The patient denies any symptoms of chest pain, palpitations, shortness of breath, lightheadedness, dizziness, leg swelling, orthopnea, PND, and syncope.  Soc: He has been married for nearly 70 years, and they were high school sweethearts. He was born in [place] but moved to [place] in [place]. 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 He and his wife have 5 children, all of whom live in [place].  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   Review of Systems: As per "subjective", otherwise negative.  No Known Allergies  Current Outpatient Prescriptions  Medication Sig Dispense Refill  . aspirin 81 MG tablet Take 81 mg by mouth daily.      Marland Kitchen. atorvastatin (LIPITOR) 40 MG tablet TAKE (1) TABLET BY MOUTH ONCE DAILY. 30 tablet 3  . losartan (COZAAR) 50 MG tablet Take 50 mg by mouth daily.    . metoprolol tartrate (LOPRESSOR) 25 MG tablet TAKE (1/2) TABLET BY MOUTH 2 TIMES A DAY. 30 tablet 6  . Tamsulosin HCl (FLOMAX) 0.4 MG CAPS Take 0.4 mg by mouth daily after supper.       No current facility-administered medications for this visit.     Past Medical History:  Diagnosis Date  . Arteriosclerotic cardiovascular disease (ASCVD)    acute IMI in 2004 with subsequent CABG; cath 2009 60% left main; atretic LIMA; occluded SVG to M1; patent grafts to RCA and D1;  EF 40-45% with inferior hypokinesis on echo in 3/11; pharmacologic stress nuclear in 3/11-EF of 51%, mild basilar inferolateral ischemia  . History of myocardial perfusion scan  2009,2011  . Hyperlipidemia    Lipid profile in 2009:159, 57, 72, 76; 2012:176, 53, 76, 89  . Hypertension    CBC and CMet normal in 12/2010  . Tobacco abuse, in remission    Quit in 1978    Past Surgical History:  Procedure Laterality Date  . CARDIAC CATHETERIZATION    . CATARACT EXTRACTION W/PHACO Right 12/11/2015   Procedure: CATARACT EXTRACTION PHACO AND INTRAOCULAR LENS PLACEMENT (IOC);  Surgeon: Jethro BolusMark Shapiro, MD;  Location: [facility];  Service: Ophthalmology;  Laterality: Right;  CDE: 9.44  . CORONARY ARTERY BYPASS GRAFT  2004  . HERNIA REPAIR      Social History   Social History  . Marital status: Married    Spouse name: N/A  . Number of children: 5  . Years of education: N/A   Occupational History  . retired     [employer]   Social History Main Topics  . Smoking status: Former Smoker    Packs/day: 0.50    Years: 4.00    Types: Cigarettes    Start date: 06/20/1942    Quit date: 12/22/1965  . Smokeless tobacco: Never Used  . Alcohol use No  . Drug use: No  . Sexual activity: Not on file   Other Topics Concern  . Not on file   Social History Narrative  . No narrative on file     Vitals:   02/22/16 1600  BP: 126/72  Pulse: 68  SpO2: 95%  Weight: 143 lb (64.9 kg)  Height: 5' (1.524 m)    PHYSICAL EXAM General: NAD HEENT: Normal. Neck: No JVD, no thyromegaly. Lungs: Clear to auscultation bilaterally with normal respiratory effort. CV: Nondisplaced PMI.  Regular rate and rhythm, normal S1/S2, no S3/S4, no murmur. No pretibial or periankle edema.  No carotid bruit.   Abdomen: Soft, nontender, no distention.  Neurologic: Alert and oriented.  Psych: Normal affect. Skin: Normal. Musculoskeletal: No gross deformities.    ECG: Most recent ECG reviewed.      ASSESSMENT AND PLAN: 1. CAD/CABG: Stable ischemic heart disease. Continue aspirin, Lipitor and metoprolol.  2. Essential HTN: Well controlled on current therapy. No medication  changes.  3. Hyperlipidemia: Continue Lipitor at present dose.  4. Near syncope: No recurrences. Instructed to move slowly from sitting to standing position.  Dispo: f/u 1 year.   Prentice Docker, M.D., F.A.C.C.

## 2016-02-29 ENCOUNTER — Other Ambulatory Visit: Payer: Self-pay | Admitting: Cardiovascular Disease

## 2016-04-10 DIAGNOSIS — R1310 Dysphagia, unspecified: Secondary | ICD-10-CM | POA: Diagnosis not present

## 2016-04-10 DIAGNOSIS — Z6824 Body mass index (BMI) 24.0-24.9, adult: Secondary | ICD-10-CM | POA: Diagnosis not present

## 2016-04-10 DIAGNOSIS — R070 Pain in throat: Secondary | ICD-10-CM | POA: Diagnosis not present

## 2016-04-10 DIAGNOSIS — Z1389 Encounter for screening for other disorder: Secondary | ICD-10-CM | POA: Diagnosis not present

## 2016-04-12 ENCOUNTER — Encounter (HOSPITAL_COMMUNITY): Payer: Self-pay | Admitting: Emergency Medicine

## 2016-04-12 ENCOUNTER — Emergency Department (HOSPITAL_COMMUNITY)
Admission: EM | Admit: 2016-04-12 | Discharge: 2016-04-12 | Disposition: A | Discharge status: Home / Self Care | Payer: Medicare Other | Attending: Emergency Medicine | Admitting: Emergency Medicine

## 2016-04-12 ENCOUNTER — Emergency Department (HOSPITAL_COMMUNITY): Payer: Medicare Other

## 2016-04-12 DIAGNOSIS — J36 Peritonsillar abscess: Secondary | ICD-10-CM | POA: Diagnosis not present

## 2016-04-12 DIAGNOSIS — Z79899 Other long term (current) drug therapy: Secondary | ICD-10-CM | POA: Insufficient documentation

## 2016-04-12 DIAGNOSIS — J029 Acute pharyngitis, unspecified: Secondary | ICD-10-CM | POA: Diagnosis present

## 2016-04-12 DIAGNOSIS — Z7982 Long term (current) use of aspirin: Secondary | ICD-10-CM | POA: Insufficient documentation

## 2016-04-12 DIAGNOSIS — M542 Cervicalgia: Secondary | ICD-10-CM | POA: Diagnosis not present

## 2016-04-12 DIAGNOSIS — I10 Essential (primary) hypertension: Secondary | ICD-10-CM | POA: Insufficient documentation

## 2016-04-12 DIAGNOSIS — Z87891 Personal history of nicotine dependence: Secondary | ICD-10-CM | POA: Diagnosis not present

## 2016-04-12 LAB — CBC
HCT: 41.7 % (ref 39.0–52.0)
Hemoglobin: 14.4 g/dL (ref 13.0–17.0)
MCH: 34.9 pg — ABNORMAL HIGH (ref 26.0–34.0)
MCHC: 34.5 g/dL (ref 30.0–36.0)
MCV: 101 fL — ABNORMAL HIGH (ref 78.0–100.0)
Platelets: 140 K/uL — ABNORMAL LOW (ref 150–400)
RBC: 4.13 MIL/uL — ABNORMAL LOW (ref 4.22–5.81)
RDW: 14.6 % (ref 11.5–15.5)
WBC: 7.9 K/uL (ref 4.0–10.5)

## 2016-04-12 LAB — BASIC METABOLIC PANEL
Anion gap: 8 (ref 5–15)
BUN: 21 mg/dL — AB (ref 6–20)
CALCIUM: 8.9 mg/dL (ref 8.9–10.3)
CO2: 26 mmol/L (ref 22–32)
CREATININE: 1.26 mg/dL — AB (ref 0.61–1.24)
Chloride: 102 mmol/L (ref 101–111)
GFR calc Af Amer: 57 mL/min — ABNORMAL LOW (ref >60)
GFR calc non Af Amer: 49 mL/min — ABNORMAL LOW (ref >60)
GLUCOSE: 123 mg/dL — AB (ref 65–99)
Potassium: 4.5 mmol/L (ref 3.5–5.1)
Sodium: 136 mmol/L (ref 135–145)

## 2016-04-12 MED ORDER — IOPAMIDOL (ISOVUE-300) INJECTION 61%
75.0000 mL | Freq: Once | INTRAVENOUS | Status: AC | PRN
  Administered 2016-04-12: 75 mL via INTRAVENOUS

## 2016-04-12 MED ORDER — SODIUM CHLORIDE 0.9 % IV BOLUS (SEPSIS)
1000.0000 mL | Freq: Once | INTRAVENOUS | Status: AC
  Administered 2016-04-12: 1000 mL via INTRAVENOUS

## 2016-04-12 MED ORDER — LIDOCAINE HCL (PF) 2 % IJ SOLN
10.0000 mL | Freq: Once | INTRAMUSCULAR | Status: AC
  Administered 2016-04-12: 5 mL
  Filled 2016-04-12: qty 10

## 2016-04-12 MED ORDER — CLINDAMYCIN HCL 300 MG PO CAPS: 300.0000 mg | ORAL_CAPSULE | Freq: Three times a day (TID) | ORAL | 0 refills | Status: DC

## 2016-04-12 NOTE — ED Triage Notes (Signed)
Pt states that he has had a sore throat since last Wednesday and is having pain while swallowing.  Pt denies fevers, chills, and cough.

## 2016-04-12 NOTE — ED Provider Notes (Signed)
[facility] Provider Note   CSN: [id number] Arrival date & time: 04/12/16  [id number]  By signing my name below, I, Nelwyn SalisburyJoshua Fowler, attest that this documentation has been prepared under the direction and in the presence of Azalia BilisKevin Josealfredo Adkins, MD . Electronically Signed: Nelwyn SalisburyJoshua Fowler, Scribe. 04/12/2016. 8:39 AM.  History   Chief Complaint Chief Complaint  Patient presents with  . Sore Throat    hurts to swallow   The history is provided by the patient. No language interpreter was used.    HPI Comments:  Charlott HollerWillie L Garrelts is a 82 y.o. male with pmhx of HLD and HTN who presents to the Emergency Department complaining of sudden-onset constant unchanged sore throat beginning about 10 days ago. Pt describes his pain as being worse on the left side of his throat and exacerbated by swallowing. He notes that he recently was seen by a Nurse Practitioner last weekwho prescribed him Azithromycin with no relief. Pt reports associated decreased appetite secondary to pain. He denies any cough or fever. He also denies any sick contacts.  Past Medical History:  Diagnosis Date  . Arteriosclerotic cardiovascular disease (ASCVD)    acute IMI in 2004 with subsequent CABG; cath 2009 60% left main; atretic LIMA; occluded SVG to M1; patent grafts to RCA and D1;  EF 40-45% with inferior hypokinesis on echo in 3/11; pharmacologic stress nuclear in 3/11-EF of 51%, mild basilar inferolateral ischemia  . History of myocardial perfusion scan 2009,2011  . Hyperlipidemia    Lipid profile in 2009:159, 57, 72, 76; 2012:176, 53, 76, 89  . Hypertension    CBC and CMet normal in 12/2010  . Tobacco abuse, in remission    Quit in 1978    Patient Active Problem List   Diagnosis Date Noted  . Arteriosclerotic cardiovascular disease (ASCVD)   . Hyperlipidemia   . Tobacco abuse, in remission   . Hypertension     Past Surgical History:  Procedure Laterality Date  . CARDIAC CATHETERIZATION    . CATARACT EXTRACTION  W/PHACO Right 12/11/2015   Procedure: CATARACT EXTRACTION PHACO AND INTRAOCULAR LENS PLACEMENT (IOC);  Surgeon: Jethro BolusMark Shapiro, MD;  Location: [facility];  Service: Ophthalmology;  Laterality: Right;  CDE: 9.44  . CORONARY ARTERY BYPASS GRAFT  2004  . HERNIA REPAIR         Home Medications    Prior to Admission medications   Medication Sig Start Date End Date Taking? Authorizing Provider  aspirin 81 MG tablet Take 81 mg by mouth daily.      Historical Provider, MD  atorvastatin (LIPITOR) 40 MG tablet TAKE (1) TABLET BY MOUTH ONCE DAILY. 02/29/16   Laqueta LindenSuresh A Koneswaran, MD  losartan (COZAAR) 50 MG tablet Take 50 mg by mouth daily.    Historical Provider, MD  metoprolol tartrate (LOPRESSOR) 25 MG tablet TAKE (1/2) TABLET BY MOUTH 2 TIMES A DAY. 11/30/15   Laqueta LindenSuresh A Koneswaran, MD  Tamsulosin HCl (FLOMAX) 0.4 MG CAPS Take 0.4 mg by mouth daily after supper.      Historical Provider, MD    Family History Family History  Problem Relation Age of Onset  . Heart disease Father     and a brother  . Stroke Mother     and brother    Social History Social History  Substance Use Topics  . Smoking status: Former Smoker    Packs/day: 0.50    Years: 4.00    Types: Cigarettes    Start date: 06/20/1942    Quit date:  12/22/1965  . Smokeless tobacco: Never Used  . Alcohol use No     Allergies   Patient has no known allergies.   Review of Systems Review of Systems 10 systems reviewed and all are negative for acute change except as noted in the HPI.  Physical Exam Updated Vital Signs BP 144/70 (BP Location: Right Arm)   Pulse 86   Temp 97.7 F (36.5 C) (Oral)   Resp 16   Ht 5\' 4"  (1.626 m)   Wt 153 lb (69.4 kg)   SpO2 96%   BMI 26.26 kg/m   Physical Exam  Constitutional: He is oriented to person, place, and time. He appears well-developed and well-nourished.  HENT:  Head: Normocephalic.  Mouth/Throat: Uvula is midline. No oropharyngeal exudate.  Mild prominence of the left  paritonsillar recess. No obvious exudate. Tolerating secretions. Oral airway patent. Voice normal.   Eyes: EOM are normal.  Neck: Normal range of motion. Neck supple.  No lymphadenopathy. No anterior neck erythema.   Pulmonary/Chest: Effort normal.  Abdominal: He exhibits no distension.  Musculoskeletal: Normal range of motion.  Neurological: He is alert and oriented to person, place, and time.  Psychiatric: He has a normal mood and affect.  Nursing note and vitals reviewed.    ED Treatments / Results  DIAGNOSTIC STUDIES:  Oxygen Saturation is 96% on RA, low by my interpretation.    COORDINATION OF CARE:  8:58 AM Discussed treatment plan with pt at bedside which includes CT imaging and pt agreed to plan.  Labs (all labs ordered are listed, but only abnormal results are displayed) Labs Reviewed  CBC - Abnormal; Notable for the following:       Result Value   RBC 4.13 (*)    MCV 101.0 (*)    MCH 34.9 (*)    Platelets 140 (*)    All other components within normal limits  BASIC METABOLIC PANEL - Abnormal; Notable for the following:    Glucose, Bld 123 (*)    BUN 21 (*)    Creatinine, Ser 1.26 (*)    GFR calc non Af Amer 49 (*)    GFR calc Af Amer 57 (*)    All other components within normal limits    EKG  EKG Interpretation None       Radiology Ct Soft Tissue Neck W Contrast  Result Date: 04/12/2016 CLINICAL DATA:  Worsening sore throat, 4 days duration. Pain left worse than right. EXAM: CT NECK WITH CONTRAST TECHNIQUE: Multidetector CT imaging of the neck was performed using the standard protocol following the bolus administration of intravenous contrast. CONTRAST:  75mL ISOVUE-300 IOPAMIDOL (ISOVUE-300) INJECTION 61% COMPARISON:  None. FINDINGS: Pharynx and larynx: Left tonsillar enlargement with a peritonsillar abscess measuring 16 x 13 mm. No deep space extension. No other mucosal or submucosal finding. Salivary glands: Parotid and submandibular glands are normal.  Thyroid: Normal Lymph nodes: No enlarged or low-density nodes on either side of the neck. Vascular: Veins are patent. There is atherosclerotic disease at both carotid bifurcations. Atherosclerotic disease also affects the aortic arch and brachiocephalic vessels, particularly the proximal left subclavian artery. Limited intracranial: Normal Visualized orbits: Normal Mastoids and visualized paranasal sinuses: Clear Skeleton: Degenerative cervical spondylosis. Upper chest: Negative Other: None significant IMPRESSION: Peritonsillar abscess on the left measuring 16 x 13 mm. No deep space extension. Electronically Signed   By: Paulina FusiMark  Shogry M.D.   On: 04/12/2016 10:38    Procedures Procedures (including critical care time)     +++++++++++++++++++++++++++++++++++++++++++  ASPIRATION of PERITONSILLAR ABSCESS Performed by: Lyanne Co Consent: Verbal consent obtained. Risks and benefits: risks, benefits and alternatives were discussed Time out performed prior to procedure Type: abscess Body area: peritonsillar abscess left Anesthesia: lidocaine neb Aspiration made with 20 gauge spinal needle Local anesthetic:hurricaine spray Complexity: simple Drainage: purulent Drainage amount: 1.5cc Packing material: purulent Patient tolerance: Patient tolerated the procedure well with no immediate complications.  ++++++++++++++++++++++++++++++++++++++++++++++    Medications Ordered in ED Medications - No data to display   Initial Impression / Assessment and Plan / ED Course  I have reviewed the triage vital signs and the nursing notes.  Pertinent labs & imaging results that were available during my care of the patient were reviewed by me and considered in my medical decision making (see chart for details).  Clinical Course     Patient be switched over to clindamycin.  ENT follow-up if not improving.  Feels much better after aspiration of his peritonsillar abscess.     Final Clinical  Impressions(s) / ED Diagnoses   Final diagnoses:  Peritonsillar abscess    New Prescriptions New Prescriptions   No medications on file   I personally performed the services described in this documentation, which was scribed in my presence. The recorded information has been reviewed and is accurate.        Azalia Bilis, MD 04/12/16 [phone]

## 2016-07-31 ENCOUNTER — Other Ambulatory Visit: Payer: Self-pay | Admitting: Cardiovascular Disease

## 2016-11-03 DIAGNOSIS — Z6823 Body mass index (BMI) 23.0-23.9, adult: Secondary | ICD-10-CM | POA: Diagnosis not present

## 2016-11-03 DIAGNOSIS — I10 Essential (primary) hypertension: Secondary | ICD-10-CM | POA: Diagnosis not present

## 2016-11-03 DIAGNOSIS — H6123 Impacted cerumen, bilateral: Secondary | ICD-10-CM | POA: Diagnosis not present

## 2016-11-03 DIAGNOSIS — Z1389 Encounter for screening for other disorder: Secondary | ICD-10-CM | POA: Diagnosis not present

## 2017-02-27 ENCOUNTER — Encounter: Payer: Self-pay | Admitting: Cardiovascular Disease

## 2017-02-27 ENCOUNTER — Ambulatory Visit (INDEPENDENT_AMBULATORY_CARE_PROVIDER_SITE_OTHER): Payer: Medicare Other | Admitting: Cardiovascular Disease

## 2017-02-27 VITALS — BP 142/84 | HR 64 | Ht 64.0 in | Wt 141.2 lb

## 2017-02-27 DIAGNOSIS — I25768 Atherosclerosis of bypass graft of coronary artery of transplanted heart with other forms of angina pectoris: Secondary | ICD-10-CM | POA: Diagnosis not present

## 2017-02-27 DIAGNOSIS — I10 Essential (primary) hypertension: Secondary | ICD-10-CM | POA: Diagnosis not present

## 2017-02-27 DIAGNOSIS — E785 Hyperlipidemia, unspecified: Secondary | ICD-10-CM

## 2017-02-27 NOTE — Progress Notes (Signed)
SUBJECTIVE: The patient is a 83 year old male with a history of coronary artery disease and CABG as well as essential hypertension.  The patient denies any symptoms of chest pain, palpitations, shortness of breath, lightheadedness, dizziness, leg swelling, orthopnea, PND, and syncope.  He has 2 daughters who graduated from [employer]. He has a granddaughter who recently graduated from [employer] as well and has gone back to do her masters in nursing. He has a son who teaches English in [place] at a local college. He is a Nutritional therapist.  The patient remains very active. He said his wife is much sicker than he is.  ECG performed in the office today which I ordered and personally interpreted demonstrated sinus bradycardia, 52 bpm. There were no significant ST segment or T-wave abnormalities, nor any arrhythmias.  Soc Hx: He has been married for over 70 years, and they were high school sweethearts. He was born in [place] but moved to [place] in 1940. 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 He and his wife have 5 children, all of whom live in [place].  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   Review of Systems: As per "subjective", otherwise negative.  No Known Allergies  Current Outpatient Prescriptions  Medication Sig Dispense Refill  . aspirin 81 MG tablet Take 81 mg by mouth daily.      Marland Kitchen atorvastatin (LIPITOR) 40 MG tablet TAKE (1) TABLET BY MOUTH ONCE DAILY. 30 tablet 11  . clindamycin (CLEOCIN) 300 MG capsule Take 1 capsule (300 mg total) by mouth 3 (three) times daily. 21 capsule 0  . losartan (COZAAR) 50 MG tablet Take 50 mg by mouth daily.    . metoprolol tartrate (LOPRESSOR) 25 MG tablet TAKE (1/2) TABLET BY MOUTH 2 TIMES A DAY. 30 tablet 6  . naproxen sodium (ANAPROX) 220 MG tablet Take 330 mg by mouth daily as needed (pain).    . Polyvinyl  Alcohol-Povidone (CLEAR EYES ALL SEASONS OP) Apply 1 drop to eye daily as needed (red eyes).    . Tamsulosin HCl (FLOMAX) 0.4 MG CAPS Take 0.4 mg by mouth daily after supper.       No current facility-administered medications for this visit.     Past Medical History:  Diagnosis Date  . Arteriosclerotic cardiovascular disease (ASCVD)    acute IMI in 2004 with subsequent CABG; cath 2009 60% left main; atretic LIMA; occluded SVG to M1; patent grafts to RCA and D1;  EF 40-45% with inferior hypokinesis on echo in 3/11; pharmacologic stress nuclear in 3/11-EF of 51%, mild basilar inferolateral ischemia  . History of myocardial perfusion scan 2009,2011  . Hyperlipidemia    Lipid profile in 2009:159, 57, 72, 76; 2012:176, 53, 76, 89  . Hypertension    CBC and CMet normal in 12/2010  . Tobacco abuse, in remission    Quit in 1978    Past Surgical History:  Procedure Laterality Date  . CARDIAC CATHETERIZATION    . CATARACT EXTRACTION W/PHACO Right 12/11/2015   Procedure: CATARACT EXTRACTION PHACO AND INTRAOCULAR LENS PLACEMENT (IOC);  Surgeon: Jethro Bolus, MD;  Location: [facility];  Service: Ophthalmology;  Laterality: Right;  CDE: 9.44  . CORONARY ARTERY BYPASS GRAFT  2004  . HERNIA REPAIR      Social History   Social History  . Marital status: Married    Spouse name: N/A  .  Number of children: 5  . Years of education: N/A   Occupational History  . retired     [employer]al architect [employer]   Social History Main Topics  . Smoking status: Former Smoker    Packs/day: 0.50    Years: 4.00    Types: Cigarettes    Start date: 06/20/1942    Quit date: 12/22/1965  . Smokeless tobacco: Never Used  . Alcohol use No  . Drug use: No  . Sexual activity: Not on file   Other Topics Concern  . Not on file   Social History Narrative  . No narrative on file     Vitals:   02/27/17 1442  BP: (!) 142/84  Pulse: 64  SpO2: 95%  Weight: 141 lb 3.2 oz (64 kg)  Height:  (1.626 m)    Wt Readings  from Last 3 Encounters:  02/27/17 141 lb 3.2 oz (64 kg)  04/12/16 153 lb (69.4 kg)  02/22/16 143 lb (64.9 kg)     PHYSICAL EXAM General: NAD HEENT: Normal. Neck: No JVD, no thyromegaly. Lungs: Clear to auscultation bilaterally with normal respiratory effort. CV: Nondisplaced PMI.  Regular rate and rhythm, normal S1/S2, no S3/S4, no murmur. No pretibial or periankle edema.  No carotid bruit.   Abdomen: Soft, nontender, no distention.  Neurologic: Alert and oriented.  Psych: Normal affect. Skin: Normal. Musculoskeletal: No gross deformities.    ECG: Most recent ECG reviewed.   Labs: Lab Results  Component Value Date/Time   K 4.5 04/12/2016 09:05 AM   BUN 21 (H) 04/12/2016 09:05 AM   CREATININE 1.26 (H) 04/12/2016 09:05 AM   HGB 14.4 04/12/2016 09:05 AM     Lipids: Lab Results  Component Value Date/Time   LDLCALC 91 03/04/2012 08:21 AM   CHOL 174 03/04/2012 08:21 AM   TRIG 58 03/04/2012 08:21 AM   HDL 71 03/04/2012 08:21 AM       ASSESSMENT AND PLAN:  1. CAD/CABG: Stable ischemic heart disease. Continue aspirin, Lipitor and metoprolol.  2. Essential HTN: Mildly elevated on current therapy. I will monitor. No medication changes.  3. Hyperlipidemia: Continue Lipitor at present dose. I will obtain a copy of lipids.     Disposition: Follow up 1 year.   Prentice Docker, M.D., F.A.C.C.

## 2017-02-27 NOTE — Patient Instructions (Signed)
Your physician wants you to follow-up in:  1 year with Dr.Koneswaran You will receive a reminder letter in the mail two months in advance. If you don't receive a letter, please call our office to schedule the follow-up appointment.    Your physician recommends that you continue on your current medications as directed. Please refer to the Current Medication list given to you today.    If you need a refill on your cardiac medications before your next appointment, please call your pharmacy.      No lab work or tests ordered today.      Thank you for choosing [facility] !        

## 2017-03-30 ENCOUNTER — Other Ambulatory Visit: Payer: Self-pay | Admitting: Cardiovascular Disease

## 2017-04-30 ENCOUNTER — Other Ambulatory Visit: Payer: Self-pay | Admitting: Cardiovascular Disease

## 2017-06-30 ENCOUNTER — Other Ambulatory Visit: Payer: Self-pay | Admitting: Cardiovascular Disease

## 2017-07-30 ENCOUNTER — Other Ambulatory Visit: Payer: Self-pay | Admitting: Cardiovascular Disease

## 2017-08-14 DIAGNOSIS — H40002 Preglaucoma, unspecified, left eye: Secondary | ICD-10-CM | POA: Diagnosis not present

## 2017-08-14 DIAGNOSIS — H524 Presbyopia: Secondary | ICD-10-CM | POA: Diagnosis not present

## 2017-11-13 DIAGNOSIS — Z0000 Encounter for general adult medical examination without abnormal findings: Secondary | ICD-10-CM | POA: Diagnosis not present

## 2017-11-13 DIAGNOSIS — E748 Other specified disorders of carbohydrate metabolism: Secondary | ICD-10-CM | POA: Diagnosis not present

## 2017-11-13 DIAGNOSIS — N400 Enlarged prostate without lower urinary tract symptoms: Secondary | ICD-10-CM | POA: Diagnosis not present

## 2017-11-13 DIAGNOSIS — Z6823 Body mass index (BMI) 23.0-23.9, adult: Secondary | ICD-10-CM | POA: Diagnosis not present

## 2017-11-13 DIAGNOSIS — E782 Mixed hyperlipidemia: Secondary | ICD-10-CM | POA: Diagnosis not present

## 2017-11-13 DIAGNOSIS — Z1389 Encounter for screening for other disorder: Secondary | ICD-10-CM | POA: Diagnosis not present

## 2017-11-18 DIAGNOSIS — E748 Other specified disorders of carbohydrate metabolism: Secondary | ICD-10-CM | POA: Diagnosis not present

## 2017-11-18 DIAGNOSIS — R7309 Other abnormal glucose: Secondary | ICD-10-CM | POA: Diagnosis not present

## 2017-11-18 DIAGNOSIS — D696 Thrombocytopenia, unspecified: Secondary | ICD-10-CM | POA: Diagnosis not present

## 2017-12-26 ENCOUNTER — Other Ambulatory Visit: Payer: Self-pay | Admitting: Cardiovascular Disease

## 2017-12-31 DIAGNOSIS — H43391 Other vitreous opacities, right eye: Secondary | ICD-10-CM | POA: Diagnosis not present

## 2017-12-31 DIAGNOSIS — H26492 Other secondary cataract, left eye: Secondary | ICD-10-CM | POA: Diagnosis not present

## 2017-12-31 DIAGNOSIS — Z961 Presence of intraocular lens: Secondary | ICD-10-CM | POA: Diagnosis not present

## 2018-02-05 ENCOUNTER — Ambulatory Visit: Payer: Medicare Other | Admitting: Urology

## 2018-02-05 ENCOUNTER — Other Ambulatory Visit (HOSPITAL_COMMUNITY)
Admission: AD | Admit: 2018-02-05 | Discharge: 2018-02-05 | Disposition: A | Discharge status: Home / Self Care | Payer: Medicare Other | Source: Skilled Nursing Facility | Attending: Urology | Admitting: Urology

## 2018-02-05 DIAGNOSIS — R3121 Asymptomatic microscopic hematuria: Secondary | ICD-10-CM | POA: Diagnosis not present

## 2018-02-05 DIAGNOSIS — R972 Elevated prostate specific antigen [PSA]: Secondary | ICD-10-CM

## 2018-02-05 DIAGNOSIS — Z6822 Body mass index (BMI) 22.0-22.9, adult: Secondary | ICD-10-CM | POA: Diagnosis not present

## 2018-02-05 DIAGNOSIS — E782 Mixed hyperlipidemia: Secondary | ICD-10-CM | POA: Diagnosis not present

## 2018-02-05 DIAGNOSIS — E063 Autoimmune thyroiditis: Secondary | ICD-10-CM | POA: Diagnosis not present

## 2018-02-05 DIAGNOSIS — H43812 Vitreous degeneration, left eye: Secondary | ICD-10-CM | POA: Diagnosis not present

## 2018-02-05 DIAGNOSIS — I10 Essential (primary) hypertension: Secondary | ICD-10-CM | POA: Diagnosis not present

## 2018-02-05 LAB — URINALYSIS, COMPLETE (UACMP) WITH MICROSCOPIC
BILIRUBIN URINE: NEGATIVE
Glucose, UA: NEGATIVE mg/dL
KETONES UR: NEGATIVE mg/dL
LEUKOCYTES UA: NEGATIVE
Nitrite: NEGATIVE
PROTEIN: NEGATIVE mg/dL
Specific Gravity, Urine: 1.016 (ref 1.005–1.030)
pH: 5 (ref 5.0–8.0)

## 2018-02-26 ENCOUNTER — Ambulatory Visit: Payer: Medicare Other | Admitting: Urology

## 2018-03-23 DIAGNOSIS — E782 Mixed hyperlipidemia: Secondary | ICD-10-CM | POA: Diagnosis not present

## 2018-03-23 DIAGNOSIS — I2510 Atherosclerotic heart disease of native coronary artery without angina pectoris: Secondary | ICD-10-CM | POA: Diagnosis not present

## 2018-03-23 DIAGNOSIS — E063 Autoimmune thyroiditis: Secondary | ICD-10-CM | POA: Diagnosis not present

## 2018-03-23 DIAGNOSIS — Z6822 Body mass index (BMI) 22.0-22.9, adult: Secondary | ICD-10-CM | POA: Diagnosis not present

## 2018-03-24 ENCOUNTER — Ambulatory Visit (INDEPENDENT_AMBULATORY_CARE_PROVIDER_SITE_OTHER): Payer: Medicare Other | Admitting: Cardiovascular Disease

## 2018-03-24 ENCOUNTER — Encounter: Payer: Self-pay | Admitting: Cardiovascular Disease

## 2018-03-24 VITALS — BP 122/70 | HR 69 | Ht 64.0 in | Wt 133.4 lb

## 2018-03-24 DIAGNOSIS — E785 Hyperlipidemia, unspecified: Secondary | ICD-10-CM

## 2018-03-24 DIAGNOSIS — I10 Essential (primary) hypertension: Secondary | ICD-10-CM

## 2018-03-24 DIAGNOSIS — I25768 Atherosclerosis of bypass graft of coronary artery of transplanted heart with other forms of angina pectoris: Secondary | ICD-10-CM | POA: Diagnosis not present

## 2018-03-24 NOTE — Patient Instructions (Signed)

## 2018-03-24 NOTE — Progress Notes (Signed)
SUBJECTIVE: The patient presents for routine follow-up.  He has a history of coronary artery disease and CABG as well as hypertension.  ECG performed in the office today which I ordered and personally interpreted demonstrates sinus bradycardia with PVCs, old inferior infarct, and nonspecific T wave abnormalities.  We talked quite a bit about his life.  He feels very blessed.  He is gone through a lot of tragedies recently.  His wife of 71 years passed away in September 13, 2017.  She was also my patient.  There is son died of an overdose in 10-13-2016.  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  He tries to keep himself active and is involved with many activities at church.  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  The patient denies any symptoms of chest pain, palpitations, shortness of breath, lightheadedness, dizziness, leg swelling, orthopnea, PND, and syncope.    Soc Hx: He was married for 71 years.  His wife, Karena Addison, passed away in Sep 13, 2017.  She was also my patient.  They were high school sweethearts. He was born in [place] but moved to [place] in [birth year]14[birth year]1938. 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  He served with the [employer] and was in [place] for a time.   He and his wife had 5 children. His son passed away in his 59s of an overdose in 13-Oct-2016.  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 He has 3 daughters who graduated from [employer].  They all work for [employer].  One lives in [place], one in [place], and 1 year [place].   He has a granddaughter who graduated from [employer] in 10/13/2016 and has gone back to do her masters in nursing.  He is a Nutritional therapist.  Review of Systems: As per "subjective", otherwise negative.  No Known Allergies  Current Outpatient Medications    Medication Sig Dispense Refill  . aspirin 81 MG tablet Take 81 mg by mouth daily.      Marland Kitchen atorvastatin (LIPITOR) 40 MG tablet TAKE (1) TABLET BY MOUTH ONCE DAILY. 30 tablet 3  . clindamycin (CLEOCIN) 300 MG capsule Take 1 capsule (300 mg total) by mouth 3 (three) times daily. 21 capsule 0  . losartan (COZAAR) 50 MG tablet Take 50 mg by mouth daily.    . metoprolol tartrate (LOPRESSOR) 25 MG tablet TAKE (1/2) TABLET BY MOUTH 2 TIMES A DAY. 90 tablet 3  . naproxen sodium (ANAPROX) 220 MG tablet Take 330 mg by mouth daily as needed (pain).    . Polyvinyl Alcohol-Povidone (CLEAR EYES ALL SEASONS OP) Apply 1 drop to eye daily as needed (red eyes).    . Tamsulosin HCl (FLOMAX) 0.4 MG CAPS Take 0.4 mg by mouth daily after supper.       No current facility-administered medications for this visit.     Past Medical History:  Diagnosis Date  . Arteriosclerotic cardiovascular disease (ASCVD)    acute IMI in Oct 14, 2002 with subsequent CABG; cath 10-14-2007 60% left main; atretic LIMA; occluded SVG to M1; patent grafts to RCA and D1;  EF 40-45% with inferior hypokinesis on echo in 3/11; pharmacologic stress nuclear in 3/11-EF of 51%, mild basilar inferolateral ischemia  . History of myocardial perfusion scan 10/14/07  . Hyperlipidemia    Lipid profile  in 2009:159, 57, 72, 76; 2012:176, 53, 76, 89  . Hypertension    CBC and CMet normal in 12/2010  . Tobacco abuse, in remission    Quit in 1978    Past Surgical History:  Procedure Laterality Date  . CARDIAC CATHETERIZATION    . CATARACT EXTRACTION W/PHACO Right 12/11/2015   Procedure: CATARACT EXTRACTION PHACO AND INTRAOCULAR LENS PLACEMENT (IOC);  Surgeon: Jethro Bolus, MD;  Location: [facility];  Service: Ophthalmology;  Laterality: Right;  CDE: 9.44  . CORONARY ARTERY BYPASS GRAFT  2004  . HERNIA REPAIR      Social History   Socioeconomic History  . Marital status: Married    Spouse name: Not on file  . Number of children: 5  . Years of education: Not on  file  . Highest education level: Not on file  Occupational History  . Occupation: retired    Comment: [employer]  Social Needs  . Financial resource strain: Not on file  . Food insecurity:    Worry: Not on file    Inability: Not on file  . Transportation needs:    Medical: Not on file    Non-medical: Not on file  Tobacco Use  . Smoking status: Former Smoker    Packs/day: 0.50    Years: 4.00    Pack years: 2.00    Types: Cigarettes    Start date: 06/20/1942    Last attempt to quit: 12/22/1965    Years since quitting: 52.2  . Smokeless tobacco: Never Used  Substance and Sexual Activity  . Alcohol use: No    Alcohol/week: 0.0 standard drinks  . Drug use: No  . Sexual activity: Not on file  Lifestyle  . Physical activity:    Days per week: Not on file    Minutes per session: Not on file  . Stress: Not on file  Relationships  . Social connections:    Talks on phone: Not on file    Gets together: Not on file    Attends religious service: Not on file    Active member of club or organization: Not on file    Attends meetings of clubs or organizations: Not on file    Relationship status: Not on file  . Intimate partner violence:    Fear of current or ex partner: Not on file    Emotionally abused: Not on file    Physically abused: Not on file    Forced sexual activity: Not on file  Other Topics Concern  . Not on file  Social History Narrative  . Not on file     Vitals:   03/24/18 0853  BP: 122/70  Pulse: 69  SpO2: 96%  Weight: 133 lb 6.4 oz (60.5 kg)  Height: 5\' 4"  (1.626 m)    Wt Readings from Last 3 Encounters:  03/24/18 133 lb 6.4 oz (60.5 kg)  02/27/17 141 lb 3.2 oz (64 kg)  04/12/16 153 lb (69.4 kg)     PHYSICAL EXAM General: NAD HEENT: Normal. Neck: No JVD, no thyromegaly. Lungs: Clear to auscultation bilaterally with normal respiratory effort. CV: Regular rate and rhythm with frequent premature contractions, normal S1/S2, no S3/S4, no murmur.  No pretibial or periankle edema.  No carotid bruit.   Abdomen: Soft, nontender, no distention.  Neurologic: Alert and oriented.  Psych: Normal affect. Skin: Normal. Musculoskeletal: No gross deformities.    ECG: Reviewed above under Subjective   Labs: Lab Results  Component Value Date/Time   K 4.5 04/12/2016  09:05 AM   BUN 21 (H) 04/12/2016 09:05 AM   CREATININE 1.26 (H) 04/12/2016 09:05 AM   HGB 14.4 04/12/2016 09:05 AM     Lipids: Lab Results  Component Value Date/Time   LDLCALC 91 03/04/2012 08:21 AM   CHOL 174 03/04/2012 08:21 AM   TRIG 58 03/04/2012 08:21 AM   HDL 71 03/04/2012 08:21 AM       ASSESSMENT AND PLAN:  1. CAD/CABG: Stable ischemic heart disease. Continue aspirin, Lipitor and metoprolol.  2. Essential HTN:  Controlled on current therapy which includes losartan and Lopressor.  No changes to therapy.  3. Hyperlipidemia: Continue Lipitor at present dose. I will obtain a copy of lipids from PCP.   Disposition: Follow up 6 months  Prentice Docker, M.D., F.A.C.C.

## 2018-05-24 ENCOUNTER — Telehealth: Payer: Self-pay | Admitting: Cardiovascular Disease

## 2018-05-24 ENCOUNTER — Emergency Department (HOSPITAL_COMMUNITY): Payer: Medicare Other

## 2018-05-24 ENCOUNTER — Emergency Department (HOSPITAL_COMMUNITY)
Admission: EM | Admit: 2018-05-24 | Discharge: 2018-05-24 | Disposition: A | Discharge status: Home / Self Care | Payer: Medicare Other | Attending: Emergency Medicine | Admitting: Emergency Medicine

## 2018-05-24 ENCOUNTER — Other Ambulatory Visit: Payer: Self-pay

## 2018-05-24 ENCOUNTER — Encounter (HOSPITAL_COMMUNITY): Payer: Self-pay | Admitting: *Deleted

## 2018-05-24 DIAGNOSIS — Z7982 Long term (current) use of aspirin: Secondary | ICD-10-CM | POA: Insufficient documentation

## 2018-05-24 DIAGNOSIS — Y929 Unspecified place or not applicable: Secondary | ICD-10-CM | POA: Insufficient documentation

## 2018-05-24 DIAGNOSIS — Z87891 Personal history of nicotine dependence: Secondary | ICD-10-CM | POA: Diagnosis not present

## 2018-05-24 DIAGNOSIS — Y939 Activity, unspecified: Secondary | ICD-10-CM | POA: Diagnosis not present

## 2018-05-24 DIAGNOSIS — X58XXXA Exposure to other specified factors, initial encounter: Secondary | ICD-10-CM | POA: Diagnosis not present

## 2018-05-24 DIAGNOSIS — I10 Essential (primary) hypertension: Secondary | ICD-10-CM | POA: Diagnosis not present

## 2018-05-24 DIAGNOSIS — Z79899 Other long term (current) drug therapy: Secondary | ICD-10-CM | POA: Diagnosis not present

## 2018-05-24 DIAGNOSIS — Y999 Unspecified external cause status: Secondary | ICD-10-CM | POA: Diagnosis not present

## 2018-05-24 DIAGNOSIS — S161XXA Strain of muscle, fascia and tendon at neck level, initial encounter: Secondary | ICD-10-CM | POA: Insufficient documentation

## 2018-05-24 DIAGNOSIS — S199XXA Unspecified injury of neck, initial encounter: Secondary | ICD-10-CM | POA: Diagnosis present

## 2018-05-24 DIAGNOSIS — M542 Cervicalgia: Secondary | ICD-10-CM | POA: Diagnosis not present

## 2018-05-24 MED ORDER — HYDROCODONE-ACETAMINOPHEN 5-325 MG PO TABS: 1.0000 | ORAL_TABLET | Freq: Four times a day (QID) | ORAL | 0 refills | Status: DC | PRN

## 2018-05-24 MED ORDER — ACETAMINOPHEN 325 MG PO TABS
650.0000 mg | ORAL_TABLET | Freq: Once | ORAL | Status: AC
  Administered 2018-05-24: 650 mg via ORAL
  Filled 2018-05-24: qty 2

## 2018-05-24 NOTE — ED Triage Notes (Signed)
Pt c/o posterior neck pain that started Friday, worse with movement and certain positions, denies any injury.

## 2018-05-24 NOTE — ED Provider Notes (Signed)
[facility] EMERGENCY DEPARTMENT Provider Note   CSN: [id number] Arrival date & time: 05/24/18  [id number]     History   Chief Complaint Chief Complaint  Patient presents with  . Neck Pain    HPI Charlott HollerWillie L Solomon is a 84 y.o. male.  The history is provided by the patient.  Neck Pain   This is a new problem. The current episode started more than 2 days ago. The problem occurs daily. The problem has been gradually worsening. The pain is associated with nothing. There has been no fever. The pain is present in the right side and left side. The quality of the pain is described as aching. The pain is moderate. The symptoms are aggravated by position. Pertinent negatives include no chest pain, no headaches and no weakness. He has tried heat for the symptoms. The treatment provided no relief.   Patient with history of CAD, hypertension presents with neck pain.  He reports for over 2 days has been having bilateral sided neck pain.  No trauma.  No falls.  No new weakness.  It is worse with position.  He is unable to lay flat at times due to the pain.  No headache or visual changes Patient reports he is still very active. He reports having this previously, but it typically improves spontaneously Past Medical History:  Diagnosis Date  . Arteriosclerotic cardiovascular disease (ASCVD)    acute IMI in 2004 with subsequent CABG; cath 2009 60% left main; atretic LIMA; occluded SVG to M1; patent grafts to RCA and D1;  EF 40-45% with inferior hypokinesis on echo in 3/11; pharmacologic stress nuclear in 3/11-EF of 51%, mild basilar inferolateral ischemia  . History of myocardial perfusion scan 2009,2011  . Hyperlipidemia    Lipid profile in 2009:159, 57, 72, 76; 2012:176, 53, 76, 89  . Hypertension    CBC and CMet normal in 12/2010  . Tobacco abuse, in remission    Quit in 1978    Patient Active Problem List   Diagnosis Date Noted  . Arteriosclerotic cardiovascular disease (ASCVD)   . Hyperlipidemia    . Tobacco abuse, in remission   . Hypertension     Past Surgical History:  Procedure Laterality Date  . CARDIAC CATHETERIZATION    . CATARACT EXTRACTION W/PHACO Right 12/11/2015   Procedure: CATARACT EXTRACTION PHACO AND INTRAOCULAR LENS PLACEMENT (IOC);  Surgeon: Jethro BolusMark Shapiro, MD;  Location: [facility];  Service: Ophthalmology;  Laterality: Right;  CDE: 9.44  . CORONARY ARTERY BYPASS GRAFT  2004  . HERNIA REPAIR          Home Medications    Prior to Admission medications   Medication Sig Start Date End Date Taking? Authorizing Provider  aspirin 81 MG tablet Take 81 mg by mouth daily.     Yes [provider]  atorvastatin (LIPITOR) 40 MG tablet TAKE (1) TABLET BY MOUTH ONCE DAILY. 12/28/17  Yes Laqueta LindenKoneswaran, Suresh A, MD  losartan (COZAAR) 50 MG tablet Take 50 mg by mouth daily.   Yes [provider]  metoprolol tartrate (LOPRESSOR) 25 MG tablet TAKE (1/2) TABLET BY MOUTH 2 TIMES A DAY. 07/30/17  Yes Laqueta LindenKoneswaran, Suresh A, MD  Tamsulosin HCl (FLOMAX) 0.4 MG CAPS Take 0.4 mg by mouth daily after supper.     Yes [provider]  HYDROcodone-acetaminophen (NORCO/VICODIN) 5-325 MG tablet Take 1 tablet by mouth every 6 (six) hours as needed for severe pain. 05/24/18   Zadie RhineWickline, Calixto Pavel, MD  Polyvinyl Alcohol-Povidone (CLEAR EYES ALL SEASONS  OP) Apply 1 drop to eye daily as needed (red eyes).    [provider]    Family History Family History  Problem Relation Age of Onset  . Heart disease Father        and a brother  . Stroke Mother        and brother    Social History Social History   Tobacco Use  . Smoking status: Former Smoker    Packs/day: 0.50    Years: 4.00    Pack years: 2.00    Types: Cigarettes    Start date: 06/20/1942    Last attempt to quit: 12/22/1965    Years since quitting: 52.4  . Smokeless tobacco: Never Used  Substance Use Topics  . Alcohol use: No    Alcohol/week: 0.0 standard drinks  . Drug use: No     Allergies     Patient has no known allergies.   Review of Systems Review of Systems  Constitutional: Negative for diaphoresis and fever.  HENT: Negative for trouble swallowing.   Eyes: Negative for visual disturbance.  Respiratory: Negative for shortness of breath.   Cardiovascular: Negative for chest pain.  Musculoskeletal: Positive for neck pain.  Neurological: Negative for weakness and headaches.  All other systems reviewed and are negative.    Physical Exam Updated Vital Signs BP (!) 167/99   Pulse 87   Temp 98.8 F (37.1 C) (Oral)   Resp 18   Ht 1.626 m (5\' 4" )   Wt 61.2 kg   SpO2 98%   BMI 23.17 kg/m   Physical Exam CONSTITUTIONAL: Elderly, no acute distress, appears much younger than stated age HEAD: Normocephalic/atraumatic EYES: EOMI/PERRL ENMT: Mucous membranes moist NECK: supple no meningeal signs, no bruits, no anterior neck tenderness SPINE/BACK: Cervical spine tenderness, bilateral cervical paraspinal tenderness, no bruising/crepitance/stepoffs noted to spine No erythema or warmth noted to neck CV: S1/S2 noted, no murmurs/rubs/gallops noted LUNGS: Lungs are clear to auscultation bilaterally, no apparent distress ABDOMEN: soft, nontender  NEURO: Pt is awake/alert/appropriate, moves all extremitiesx4.  No facial droop.   Equal power (5/5) with hand grip, wrist flex/extension, elbow flex/extension, and equal power with shoulder abduction/adduction.  No focal sensory deficit to light touch is noted in either UE.   EXTREMITIES: pulses normal/equal, full ROM SKIN: warm, color normal PSYCH: no abnormalities of mood noted, alert and oriented to situation   ED Treatments / Results  Labs (all labs ordered are listed, but only abnormal results are displayed) Labs Reviewed - No data to display  EKG None  Radiology No results found.  Procedures Procedures   Medications Ordered in ED Medications  acetaminophen (TYLENOL) tablet 650 mg (650 mg Oral Given 05/24/18 0641)      Initial Impression / Assessment and Plan / ED Course  I have reviewed the triage vital signs and the nursing notes.     7:06 AM Pt presents for neck pain for over 2 days No trauma No focal weakness He is well appearing Strong suspicion for muscle spasm However due to age, advised CT imaging of cspine to rule out any occult fracture Pt agreeable Signed out to Dr. Clarene DukeMcManus with CT imaging pending   Final Clinical Impressions(s) / ED Diagnoses   Final diagnoses:  Acute strain of neck muscle, initial encounter    ED Discharge Orders         Ordered    HYDROcodone-acetaminophen (NORCO/VICODIN) 5-325 MG tablet  Every 6 hours PRN     05/24/18 0651  Zadie Rhine, MD 05/24/18 [phone]

## 2018-05-24 NOTE — ED Provider Notes (Signed)
Pt received at sign out with CT pending.  CT reassuring. Tx symptomatically, f/u PMD. Dx and testing d/w pt.  Questions answered.  Verb understanding, agreeable to d/c home with outpt f/u.     Ct Cervical Spine Wo Contrast Result Date: 05/24/2018 CLINICAL DATA:  Pain for several days EXAM: CT CERVICAL SPINE WITHOUT CONTRAST TECHNIQUE: Multidetector CT imaging of the cervical spine was performed without intravenous contrast. Multiplanar CT image reconstructions were also generated. COMPARISON:  None. FINDINGS: Alignment: There is 2 mm of retrolisthesis of C3 on C4. There is 1 mm of anterolisthesis of C7 on T1. No other spondylolisthesis is evident. Skull base and vertebrae: The skull base and craniocervical junction regions appear unremarkable. There is moderate pannus posterior to the odontoid, not causing significant impression on the craniocervical junction. There is no demonstrable fracture. There is no blastic or lytic bone lesion. Soft tissues and spinal canal: Prevertebral soft tissues and predental space regions are normal. There is no cord or canal hematoma evident. No paraspinous lesions are appreciable. Disc levels: There is severe disc space narrowing at C3-4 and C6-7. There is moderately severe disc space narrowing at C4-5 and C5-6. At C2-3, there is mild facet hypertrophy bilaterally. There is no nerve root edema or effacement. No disc extrusion or stenosis. At C3-4, there is moderate facet hypertrophy bilaterally. There is moderate exit foraminal narrowing bilaterally due to bony hypertrophy. There is impression on the exiting nerve roots on each side at this level. There is no frank disc extrusion or stenosis. At C4-5, there is moderate facet hypertrophy bilaterally. There is mild narrowing of each exit foramen without significant impression on the respective exiting nerve roots. No disc extrusion or stenosis. At C5-6, there is moderate facet hypertrophy bilaterally. There is moderate  impression on the C5-6 exiting nerve root on the left due to bony hypertrophy. No frank disc extrusion or stenosis. At C6-7, there is moderate facet hypertrophy bilaterally. There is mild exit foraminal narrowing bilaterally without appreciable impression on the exiting nerve roots. No disc extrusion or stenosis. At C7-T1, there is moderate facet hypertrophy bilaterally. There is no evident nerve root edema or effacement. No disc extrusion or stenosis. Upper chest: Visualized upper lung zones are clear. Other: There is calcification in the left subclavian and carotid arteries bilaterally. IMPRESSION: No fracture. Areas of slight spondylolisthesis are felt to be due to underlying spondylosis. Multilevel arthropathy with impression on exiting nerve roots, most marked at C3-4 bilaterally. No frank disc extrusion or stenosis. Foci of subclavian and carotid artery calcification bilaterally noted. Electronically Signed   By: Bretta BangWilliam  Woodruff III M.D.   On: 05/24/2018 07:20      Samuel JesterMcManus, Niyati Heinke, DO 05/24/18 [phone]

## 2018-05-24 NOTE — ED Notes (Signed)
Pt back from CT

## 2018-05-24 NOTE — Telephone Encounter (Signed)
Seen in ED for cervical strain, given Vicodin.Plans to go home and rest, wanted to let us know

## 2018-05-24 NOTE — Telephone Encounter (Signed)
Please give pt a call - he's having a few problems and would like someone to give him a call.

## 2018-05-24 NOTE — Discharge Instructions (Addendum)
You have neck pain, possibly from a cervical strain and/or pinched nerve.  ° °SEEK IMMEDIATE MEDICAL ATTENTION IF: °You develop difficulties swallowing or breathing.  °You have new or worse numbness, weakness, tingling, or movement problems in your arms or legs.  °You develop increasing pain which is uncontrolled with medications.  °You have change in bowel or bladder function, or other concerns. ° ° ° °

## 2018-05-25 ENCOUNTER — Emergency Department (HOSPITAL_COMMUNITY)
Admission: EM | Admit: 2018-05-25 | Discharge: 2018-05-25 | Disposition: A | Discharge status: Home / Self Care | Payer: Medicare Other

## 2018-05-31 DIAGNOSIS — M5030 Other cervical disc degeneration, unspecified cervical region: Secondary | ICD-10-CM | POA: Diagnosis not present

## 2018-05-31 DIAGNOSIS — M47812 Spondylosis without myelopathy or radiculopathy, cervical region: Secondary | ICD-10-CM | POA: Diagnosis not present

## 2018-05-31 DIAGNOSIS — Z1389 Encounter for screening for other disorder: Secondary | ICD-10-CM | POA: Diagnosis not present

## 2018-05-31 DIAGNOSIS — Z6822 Body mass index (BMI) 22.0-22.9, adult: Secondary | ICD-10-CM | POA: Diagnosis not present

## 2018-05-31 DIAGNOSIS — M4312 Spondylolisthesis, cervical region: Secondary | ICD-10-CM | POA: Diagnosis not present

## 2018-05-31 DIAGNOSIS — E063 Autoimmune thyroiditis: Secondary | ICD-10-CM | POA: Diagnosis not present

## 2018-06-17 DIAGNOSIS — Z0001 Encounter for general adult medical examination with abnormal findings: Secondary | ICD-10-CM | POA: Diagnosis not present

## 2018-06-17 DIAGNOSIS — Z6822 Body mass index (BMI) 22.0-22.9, adult: Secondary | ICD-10-CM | POA: Diagnosis not present

## 2018-06-17 DIAGNOSIS — I10 Essential (primary) hypertension: Secondary | ICD-10-CM | POA: Diagnosis not present

## 2018-06-17 DIAGNOSIS — Z1389 Encounter for screening for other disorder: Secondary | ICD-10-CM | POA: Diagnosis not present

## 2018-06-17 DIAGNOSIS — E063 Autoimmune thyroiditis: Secondary | ICD-10-CM | POA: Diagnosis not present

## 2018-07-05 DIAGNOSIS — I10 Essential (primary) hypertension: Secondary | ICD-10-CM | POA: Diagnosis not present

## 2018-07-05 DIAGNOSIS — E063 Autoimmune thyroiditis: Secondary | ICD-10-CM | POA: Diagnosis not present

## 2018-07-05 DIAGNOSIS — Z6822 Body mass index (BMI) 22.0-22.9, adult: Secondary | ICD-10-CM | POA: Diagnosis not present

## 2018-07-05 DIAGNOSIS — E559 Vitamin D deficiency, unspecified: Secondary | ICD-10-CM | POA: Diagnosis not present

## 2018-07-26 DIAGNOSIS — Z6822 Body mass index (BMI) 22.0-22.9, adult: Secondary | ICD-10-CM | POA: Diagnosis not present

## 2018-07-26 DIAGNOSIS — J398 Other specified diseases of upper respiratory tract: Secondary | ICD-10-CM | POA: Diagnosis not present

## 2018-07-26 DIAGNOSIS — R0982 Postnasal drip: Secondary | ICD-10-CM | POA: Diagnosis not present

## 2018-08-02 ENCOUNTER — Other Ambulatory Visit: Payer: Self-pay | Admitting: Cardiovascular Disease

## 2018-08-21 ENCOUNTER — Ambulatory Visit (HOSPITAL_COMMUNITY)
Admission: RE | Admit: 2018-08-21 | Discharge: 2018-08-21 | Disposition: A | Discharge status: Home / Self Care | Payer: Medicare Other | Source: Ambulatory Visit | Attending: Family Medicine | Admitting: Family Medicine

## 2018-08-21 ENCOUNTER — Other Ambulatory Visit (HOSPITAL_COMMUNITY): Payer: Self-pay | Admitting: Family Medicine

## 2018-08-21 ENCOUNTER — Other Ambulatory Visit: Payer: Self-pay

## 2018-08-21 ENCOUNTER — Other Ambulatory Visit (HOSPITAL_COMMUNITY): Payer: Self-pay | Admitting: Internal Medicine

## 2018-08-21 DIAGNOSIS — R61 Generalized hyperhidrosis: Secondary | ICD-10-CM

## 2018-08-21 DIAGNOSIS — Z0001 Encounter for general adult medical examination with abnormal findings: Secondary | ICD-10-CM | POA: Diagnosis not present

## 2018-08-21 DIAGNOSIS — J439 Emphysema, unspecified: Secondary | ICD-10-CM | POA: Diagnosis not present

## 2018-08-21 DIAGNOSIS — R634 Abnormal weight loss: Secondary | ICD-10-CM

## 2018-09-01 ENCOUNTER — Other Ambulatory Visit: Payer: Self-pay | Admitting: Cardiovascular Disease

## 2018-09-07 DIAGNOSIS — E039 Hypothyroidism, unspecified: Secondary | ICD-10-CM | POA: Diagnosis not present

## 2018-09-29 ENCOUNTER — Ambulatory Visit: Payer: Medicare Other | Admitting: Cardiovascular Disease

## 2018-10-13 DIAGNOSIS — Z1389 Encounter for screening for other disorder: Secondary | ICD-10-CM | POA: Diagnosis not present

## 2018-10-13 DIAGNOSIS — K5900 Constipation, unspecified: Secondary | ICD-10-CM | POA: Diagnosis not present

## 2018-10-13 DIAGNOSIS — Z6821 Body mass index (BMI) 21.0-21.9, adult: Secondary | ICD-10-CM | POA: Diagnosis not present

## 2018-10-13 DIAGNOSIS — I10 Essential (primary) hypertension: Secondary | ICD-10-CM | POA: Diagnosis not present

## 2018-10-13 DIAGNOSIS — E063 Autoimmune thyroiditis: Secondary | ICD-10-CM | POA: Diagnosis not present

## 2018-11-02 ENCOUNTER — Ambulatory Visit (INDEPENDENT_AMBULATORY_CARE_PROVIDER_SITE_OTHER): Payer: Medicare Other | Admitting: Urology

## 2018-11-02 DIAGNOSIS — N210 Calculus in bladder: Secondary | ICD-10-CM | POA: Diagnosis not present

## 2018-11-04 ENCOUNTER — Other Ambulatory Visit (HOSPITAL_COMMUNITY): Payer: Self-pay | Admitting: Urology

## 2018-11-04 ENCOUNTER — Other Ambulatory Visit: Payer: Self-pay | Admitting: Urology

## 2018-11-04 DIAGNOSIS — R310 Gross hematuria: Secondary | ICD-10-CM

## 2018-11-04 DIAGNOSIS — N210 Calculus in bladder: Secondary | ICD-10-CM

## 2018-11-08 ENCOUNTER — Other Ambulatory Visit: Payer: Self-pay

## 2018-11-08 ENCOUNTER — Ambulatory Visit (HOSPITAL_COMMUNITY)
Admission: RE | Admit: 2018-11-08 | Discharge: 2018-11-08 | Disposition: A | Discharge status: Home / Self Care | Payer: Medicare Other | Source: Ambulatory Visit | Attending: Urology | Admitting: Urology

## 2018-11-08 ENCOUNTER — Other Ambulatory Visit (HOSPITAL_COMMUNITY): Payer: Self-pay | Admitting: Urology

## 2018-11-08 DIAGNOSIS — I700 Atherosclerosis of aorta: Secondary | ICD-10-CM | POA: Diagnosis not present

## 2018-11-08 DIAGNOSIS — N210 Calculus in bladder: Secondary | ICD-10-CM | POA: Diagnosis not present

## 2018-11-08 DIAGNOSIS — R310 Gross hematuria: Secondary | ICD-10-CM | POA: Diagnosis not present

## 2018-11-08 DIAGNOSIS — R3121 Asymptomatic microscopic hematuria: Secondary | ICD-10-CM | POA: Diagnosis not present

## 2018-11-08 DIAGNOSIS — K5730 Diverticulosis of large intestine without perforation or abscess without bleeding: Secondary | ICD-10-CM | POA: Diagnosis not present

## 2018-11-23 ENCOUNTER — Other Ambulatory Visit: Payer: Self-pay | Admitting: Urology

## 2018-11-23 ENCOUNTER — Other Ambulatory Visit (HOSPITAL_COMMUNITY): Payer: Self-pay | Admitting: Urology

## 2018-11-23 DIAGNOSIS — N210 Calculus in bladder: Secondary | ICD-10-CM

## 2018-11-23 DIAGNOSIS — R310 Gross hematuria: Secondary | ICD-10-CM

## 2018-11-30 ENCOUNTER — Other Ambulatory Visit: Payer: Self-pay | Admitting: Cardiovascular Disease

## 2019-01-24 ENCOUNTER — Ambulatory Visit (INDEPENDENT_AMBULATORY_CARE_PROVIDER_SITE_OTHER): Payer: Medicare Other | Admitting: Cardiovascular Disease

## 2019-01-24 ENCOUNTER — Encounter: Payer: Self-pay | Admitting: Cardiovascular Disease

## 2019-01-24 ENCOUNTER — Other Ambulatory Visit: Payer: Self-pay

## 2019-01-24 VITALS — BP 101/69 | HR 59 | Temp 97.3°F | Ht 64.0 in | Wt 125.0 lb

## 2019-01-24 DIAGNOSIS — I10 Essential (primary) hypertension: Secondary | ICD-10-CM

## 2019-01-24 DIAGNOSIS — E785 Hyperlipidemia, unspecified: Secondary | ICD-10-CM | POA: Diagnosis not present

## 2019-01-24 DIAGNOSIS — I25768 Atherosclerosis of bypass graft of coronary artery of transplanted heart with other forms of angina pectoris: Secondary | ICD-10-CM | POA: Diagnosis not present

## 2019-01-24 NOTE — Patient Instructions (Signed)

## 2019-01-24 NOTE — Progress Notes (Signed)
SUBJECTIVE: The patient presents for routine follow-up.  He has a history of coronary artery disease and CABG as well as hypertension.  The patient denies any symptoms of chest pain, palpitations, shortness of breath, lightheadedness, dizziness, leg swelling, orthopnea, PND, and syncope.  [place] and tripped and fell on the last step.  He hit his knees and his head.  He denies headaches and blurriness of vision.  He took a hydrocodone tablet last night and feels very well today.  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  He has a strong faith.     SocHx: He was married for71 years.  His wife, Karena Addisonrene, passed away in April 2019.  She was also my patient.  They were high school sweethearts. He was born in [place] but moved to [place] in 1940. 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  He served with the [employer] and was in [place] for a time.   He and his wife had 5 children. His son passed away in his 7250s of an overdose in 2018.  He used to teach [place] at [employer]. 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 He has 3 daughters who graduated from [employer].  They all work for [employer].  One lives in [place], one in [place], and one in [place].   He hasa granddaughter who graduated from [employer] in 2018 and has gone back to do her masters in nursing.  He is a [employer].   Review of Systems: As per "subjective", otherwise negative.  No Known Allergies  Current Outpatient Medications  Medication Sig Dispense Refill  . aspirin 81 MG tablet Take 81 mg by mouth daily.      Marland Kitchen. atorvastatin (LIPITOR) 40 MG tablet TAKE 1 TABLET BY MOUTH ONCE DAILY. 90 tablet 1  . HYDROcodone-acetaminophen (NORCO/VICODIN) 5-325 MG tablet Take 1 tablet by mouth every 6 (six)  hours as needed for severe pain. 10 tablet 0  . losartan (COZAAR) 50 MG tablet Take 50 mg by mouth daily.    . metoprolol tartrate (LOPRESSOR) 25 MG tablet TAKE (1/2) TABLET BY MOUTH 2 TIMES A DAY. 180 tablet 3  . Polyvinyl Alcohol-Povidone (CLEAR EYES ALL SEASONS OP) Apply 1 drop to eye daily as needed (red eyes).    . Tamsulosin HCl (FLOMAX) 0.4 MG CAPS Take 0.4 mg by mouth daily after supper.       No current facility-administered medications for this visit.     Past Medical History:  Diagnosis Date  . Arteriosclerotic cardiovascular disease (ASCVD)    acute IMI in 2004 with subsequent CABG; cath 2009 60% left main; atretic LIMA; occluded SVG to M1; patent grafts to RCA and D1;  EF 40-45% with inferior hypokinesis on echo in 3/11; pharmacologic stress nuclear in 3/11-EF of 51%, mild basilar inferolateral ischemia  . History of myocardial perfusion scan 2009,2011  . Hyperlipidemia    Lipid profile in 2009:159, 57, 72, 76; 2012:176, 53, 76, 89  . Hypertension    CBC and CMet normal in 12/2010  . Tobacco abuse, in remission    Quit in 1978    Past Surgical History:  Procedure Laterality Date  . CARDIAC CATHETERIZATION    . CATARACT EXTRACTION W/PHACO Right 12/11/2015   Procedure: CATARACT EXTRACTION PHACO AND INTRAOCULAR LENS  PLACEMENT (IOC);  Surgeon: Rutherford Guys, MD;  Location: [facility];  Service: Ophthalmology;  Laterality: Right;  CDE: 9.44  . CORONARY ARTERY BYPASS GRAFT  2004  . HERNIA REPAIR      Social History   Socioeconomic History  . Marital status: Widowed    Spouse name: Not on file  . Number of children: 5  . Years of education: Not on file  . Highest education level: Not on file  Occupational History  . Occupation: retired    Comment: [employer]  Social Needs  . Financial resource strain: Not on file  . Food insecurity    Worry: Not on file    Inability: Not on file  . Transportation needs    Medical: Not on file    Non-medical: Not on file  Tobacco  Use  . Smoking status: Former Smoker    Packs/day: 0.50    Years: 4.00    Pack years: 2.00    Types: Cigarettes    Start date: 06/20/1942    Quit date: 12/22/1965    Years since quitting: 53.1  . Smokeless tobacco: Never Used  Substance and Sexual Activity  . Alcohol use: No    Alcohol/week: 0.0 standard drinks  . Drug use: No  . Sexual activity: Not on file  Lifestyle  . Physical activity    Days per week: Not on file    Minutes per session: Not on file  . Stress: Not on file  Relationships  . Social Herbalist on phone: Not on file    Gets together: Not on file    Attends religious service: Not on file    Active member of club or organization: Not on file    Attends meetings of clubs or organizations: Not on file    Relationship status: Not on file  . Intimate partner violence    Fear of current or ex partner: Not on file    Emotionally abused: Not on file    Physically abused: Not on file    Forced sexual activity: Not on file  Other Topics Concern  . Not on file  Social History Narrative  . Not on file     Vitals:   01/24/19 0858  BP: 101/69  Pulse: (!) 59  Temp: (!) 97.3 F (36.3 C)  SpO2: 96%  Weight: 125 lb (56.7 kg)  Height: 5\' 4"  (1.626 m)    Wt Readings from Last 3 Encounters:  01/24/19 125 lb (56.7 kg)  05/24/18 135 lb (61.2 kg)  03/24/18 133 lb 6.4 oz (60.5 kg)     PHYSICAL EXAM General: NAD HEENT: Normal. Neck: No JVD, no thyromegaly. Lungs: Clear to auscultation bilaterally with normal respiratory effort. CV: Regular rate and rhythm, normal S1/S2, no S3/S4, no murmur. No pretibial or periankle edema.  No carotid bruit.   Abdomen: Soft, nontender, no distention.  Neurologic: Alert and oriented.  Psych: Normal affect. Skin: Normal. Musculoskeletal: No gross deformities.    ECG: Reviewed above under Subjective   Labs: Lab Results  Component Value Date/Time   K 4.5 04/12/2016 09:05 AM   BUN 21 (H) 04/12/2016 09:05 AM    CREATININE 1.26 (H) 04/12/2016 09:05 AM   HGB 14.4 04/12/2016 09:05 AM     Lipids: Lab Results  Component Value Date/Time   LDLCALC 91 03/04/2012 08:21 AM   CHOL 174 03/04/2012 08:21 AM   TRIG 58 03/04/2012 08:21 AM   HDL 71 03/04/2012 08:21 AM  ASSESSMENT AND PLAN: 1. CAD/CABG: Stable ischemic heart disease. Continue aspirin, Lipitor and metoprolol.  2. Essential HTN: Controlledon current therapy which includes losartan and Lopressor.  No changes to therapy.  3. Hyperlipidemia: Continue Lipitor at present dose.    Disposition: Follow up 6 months   Prentice DockerSuresh Lindyn Vossler, M.D., F.A.C.C.

## 2019-02-28 ENCOUNTER — Other Ambulatory Visit: Payer: Self-pay | Admitting: Cardiovascular Disease

## 2019-02-28 ENCOUNTER — Telehealth: Payer: Self-pay

## 2019-02-28 MED ORDER — ATORVASTATIN CALCIUM 40 MG PO TABS: 40.0000 mg | ORAL_TABLET | Freq: Every day | ORAL | 1 refills | Status: DC

## 2019-02-28 NOTE — Telephone Encounter (Signed)
Sent refill to pharmacy. 

## 2019-04-02 DIAGNOSIS — E782 Mixed hyperlipidemia: Secondary | ICD-10-CM | POA: Diagnosis not present

## 2019-04-02 DIAGNOSIS — I10 Essential (primary) hypertension: Secondary | ICD-10-CM | POA: Diagnosis not present

## 2019-05-02 DIAGNOSIS — E7849 Other hyperlipidemia: Secondary | ICD-10-CM | POA: Diagnosis not present

## 2019-05-02 DIAGNOSIS — I10 Essential (primary) hypertension: Secondary | ICD-10-CM | POA: Diagnosis not present

## 2019-05-02 DIAGNOSIS — E063 Autoimmune thyroiditis: Secondary | ICD-10-CM | POA: Diagnosis not present

## 2019-07-03 DIAGNOSIS — I10 Essential (primary) hypertension: Secondary | ICD-10-CM | POA: Diagnosis not present

## 2019-07-03 DIAGNOSIS — E063 Autoimmune thyroiditis: Secondary | ICD-10-CM | POA: Diagnosis not present

## 2019-07-03 DIAGNOSIS — E7849 Other hyperlipidemia: Secondary | ICD-10-CM | POA: Diagnosis not present

## 2019-08-02 ENCOUNTER — Telehealth: Payer: Self-pay

## 2019-08-02 NOTE — Telephone Encounter (Signed)
Virtual Visit Pre-Appointment Phone Call  "(Name), I am calling you today to discuss your upcoming appointment. We are currently trying to limit exposure to the virus that causes COVID-19 by seeing patients at home rather than in the office."  1. "What is the BEST phone number to call the day of the visit?" - include this in appointment notes  2. "Do you have or have access to (through a family member/friend) a smartphone with video capability that we can use for your visit?" a. If yes - list this number in appt notes as "cell" (if different from BEST phone #) and list the appointment type as a VIDEO visit in appointment notes b. If no - list the appointment type as a PHONE visit in appointment notes  Confirm consent - "In the setting of the current Covid19 crisis, you are scheduled for a (phone or video) visit with your provider on (date) at (time).  Just as we do with many in-office visits, in order for you to participate in this visit, we must obtain consent.  If you'd like, I can send this to your mychart (if signed up) or email for you to review.  Otherwise, I can obtain your verbal consent now.  All virtual visits are billed to your insurance company just like a normal visit would be.  By agreeing to a virtual visit, we'd like you to understand that the technology does not allow for your provider to perform an examination, and thus may limit your provider's ability to fully assess your condition. If your provider identifies any concerns that need to be evaluated in person, we will make arrangements to do so.  Finally, though the technology is pretty good, we cannot assure that it will always work on either your or our end, and in the setting of a video visit, we may have to convert it to a phone-only visit.  In either situation, we cannot ensure that we have a secure connection.  Are you willing to proceed?" STAFF: Did the patient verbally acknowledge consent to telehealth visit? Document  YES/NO here:  3. Advise patient to be prepared - "Two hours prior to your appointment, go ahead and check your blood pressure, pulse, oxygen saturation, and your weight (if you have the equipment to check those) and write them all down. When your visit starts, your provider will ask you for this information. If you have an Apple Watch or Kardia device, please plan to have heart rate information ready on the day of your appointment. Please have a pen and paper handy nearby the day of the visit as well."  4. Give patient instructions for MyChart download to smartphone OR Doximity/Doxy.me as below if video visit (depending on what platform provider is using)  5. Inform patient they will receive a phone call 15 minutes prior to their appointment time (may be from unknown caller ID) so they should be prepared to answer    TELEPHONE CALL NOTE  Brian Solomon has been deemed a candidate for a follow-up tele-health visit to limit community exposure during the Covid-19 pandemic. I spoke with the patient via phone to ensure availability of phone/video source, confirm preferred email & phone number, and discuss instructions and expectations.  I reminded Brian Solomon to be prepared with any vital sign and/or heart rhythm information that could potentially be obtained via home monitoring, at the time of his visit. I reminded Brian Solomon to expect a phone call prior to his visit.  [name] 08/02/2019 9:08 AM   INSTRUCTIONS FOR DOWNLOADING THE MYCHART APP TO SMARTPHONE  - The patient must first make sure to have activated MyChart and know their login information - If Apple, go to [employer] and type in MyChart in the search bar and download the app. If Android, ask patient to go to [employer] and type in [place] in the search bar and download the app. The app is free but as with any other app downloads, their phone may require them to verify saved payment information or Apple/Android  password.  - The patient will need to then log into the app with their MyChart username and password, and select [facility] as their healthcare provider to link the account. When it is time for your visit, go to the MyChart app, find appointments, and click Begin Video Visit. Be sure to Select Allow for your device to access the Microphone and Camera for your visit. You will then be connected, and your provider will be with you shortly.  **If they have any issues connecting, or need assistance please contact MyChart service desk [phone])**  **If using a computer, in order to ensure the best quality for their visit they will need to use either of the following Internet Browsers: [employer] or Google Chrome**  IF USING DOXIMITY or DOXY.ME - The patient will receive a link just prior to their visit by text.     FULL LENGTH CONSENT FOR TELE-HEALTH VISIT   I hereby voluntarily request, consent and authorize [facility] and its employed or contracted physicians, physician assistants, nurse practitioners or other licensed health care professionals (the Practitioner), to provide me with telemedicine health care services (the "Services") as deemed necessary by the treating Practitioner. I acknowledge and consent to receive the Services by the Practitioner via telemedicine. I understand that the telemedicine visit will involve communicating with the Practitioner through live audiovisual communication technology and the disclosure of certain medical information by electronic transmission. I acknowledge that I have been given the opportunity to request an in-person assessment or other available alternative prior to the telemedicine visit and am voluntarily participating in the telemedicine visit.  I understand that I have the right to withhold or withdraw my consent to the use of telemedicine in the course of my care at any time, without affecting my right to future care or treatment,  and that the Practitioner or I may terminate the telemedicine visit at any time. I understand that I have the right to inspect all information obtained and/or recorded in the course of the telemedicine visit and may receive copies of available information for a reasonable fee.  I understand that some of the potential risks of receiving the Services via telemedicine include:  Marland Kitchen Delay or interruption in medical evaluation due to technological equipment failure or disruption; . Information transmitted may not be sufficient (e.g. poor resolution of images) to allow for appropriate medical decision making by the Practitioner; and/or  . In rare instances, security protocols could fail, causing a breach of personal health information.  Furthermore, I acknowledge that it is my responsibility to provide information about my medical history, conditions and care that is complete and accurate to the best of my ability. I acknowledge that Practitioner's advice, recommendations, and/or decision may be based on factors not within their control, such as incomplete or inaccurate data provided by me or distortions of diagnostic images or specimens that may result from electronic transmissions. I understand that the practice  of medicine is not an Chief Strategy Officer and that Practitioner makes no warranties or guarantees regarding treatment outcomes. I acknowledge that I will receive a copy of this consent concurrently upon execution via email to the email address I last provided but may also request a printed copy by calling the office of [place].    I understand that my insurance will be billed for this visit.   I have read or had this consent read to me. . I understand the contents of this consent, which adequately explains the benefits and risks of the Services being provided via telemedicine.  . I have been provided ample opportunity to ask questions regarding this consent and the Services and have had my questions  answered to my satisfaction. . I give my informed consent for the services to be provided through the use of telemedicine in my medical care  By participating in this telemedicine visit I agree to the above.

## 2019-08-08 ENCOUNTER — Encounter: Payer: Self-pay | Admitting: Cardiovascular Disease

## 2019-08-08 ENCOUNTER — Telehealth (INDEPENDENT_AMBULATORY_CARE_PROVIDER_SITE_OTHER): Payer: Medicare Other | Admitting: Cardiovascular Disease

## 2019-08-08 VITALS — BP 170/90 | HR 53 | Ht 64.0 in | Wt 127.0 lb

## 2019-08-08 DIAGNOSIS — I25768 Atherosclerosis of bypass graft of coronary artery of transplanted heart with other forms of angina pectoris: Secondary | ICD-10-CM | POA: Diagnosis not present

## 2019-08-08 DIAGNOSIS — I10 Essential (primary) hypertension: Secondary | ICD-10-CM | POA: Diagnosis not present

## 2019-08-08 DIAGNOSIS — E785 Hyperlipidemia, unspecified: Secondary | ICD-10-CM | POA: Diagnosis not present

## 2019-08-08 NOTE — Progress Notes (Signed)
Virtual Visit via Telephone Note   This visit type was conducted due to national recommendations for restrictions regarding the COVID-19 Pandemic (e.g. social distancing) in an effort to limit this patient's exposure and mitigate transmission in our community.  Due to his co-morbid illnesses, this patient is at least at moderate risk for complications without adequate follow up.  This format is felt to be most appropriate for this patient at this time.  The patient did not have access to video technology/had technical difficulties with video requiring transitioning to audio format only (telephone).  All issues noted in this document were discussed and addressed.  No physical exam could be performed with this format.  Please refer to the patient's chart for his  consent to telehealth for [facility]   The patient was identified using 2 identifiers.  Date:  08/08/2019   ID:  Brian Solomon, DOB [birth year], MRN [id number]  Patient Location: Home Provider Location: Home  PCP:  Redmond School, MD  Cardiologist:  Kate Sable, MD  Electrophysiologist:  None   Evaluation Performed:  Follow-Up Visit  Chief Complaint:  CAD  History of Present Illness:    Brian Solomon is a 86 y.o. male with a history of coronary artery disease and CABG as well as hypertension.  The patient denies any symptoms of chest pain, palpitations, shortness of breath, lightheadedness, dizziness, leg swelling, orthopnea, PND, and syncope.  He exercises, sleeps well, and eats well.  He doesn't think he will renew his driver's license.  He was asked to write a book by some of the leaders in [place] but he doesn't want to.  He recently bought a [place] and he says the technology in it [place] him. It can steer itself.  He likes to go out.   SocHx: Hewasmarried for71years.His wife, Murray Hodgkins, passed away in 09-16-17. She was also my patient. Theywere high school sweethearts. He was  born in [place] but moved to [place] in 1938/09/17. 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 He served with the [employer] and was in [place] for a time.  He and his wife had5 children. His son passed away in his 60s of an overdose in 2016-09-16. 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 He has3daughters who graduated from [employer]. They all work for [employer]. One lives in [place], one in [place], and one in [place].  He hasa granddaughter who graduated from [employer] has gone back to do her masters in nursing.  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  He has a strong faith.  Past Medical History:  Diagnosis Date  . Arteriosclerotic cardiovascular disease (ASCVD)    acute IMI in 09-17-2002 with subsequent CABG; cath 17-Sep-2007 60% left main; atretic LIMA; occluded SVG to M1; patent grafts to RCA and D1;  EF 40-45% with inferior hypokinesis on echo in 3/11; pharmacologic stress nuclear in 3/11-EF of 51%, mild basilar inferolateral ischemia  . History of myocardial perfusion scan September 17, 2007  . Hyperlipidemia    Lipid profile in 2009:159, 57, 72, 76; 2012:176, 53, 76, 89  . Hypertension    CBC and CMet normal in 12/2010  . Tobacco abuse, in remission    Quit in September 16, 1976   Past Surgical History:  Procedure Laterality Date  .  CARDIAC CATHETERIZATION    . CATARACT EXTRACTION W/PHACO Right 12/11/2015   Procedure: CATARACT EXTRACTION PHACO AND INTRAOCULAR LENS PLACEMENT (IOC);  Surgeon: Jethro Bolus, MD;  Location: [facility];  Service: Ophthalmology;  Laterality: Right;  CDE: 9.44  . CORONARY ARTERY BYPASS GRAFT  2004  . HERNIA REPAIR       Current Meds  Medication Sig  . Amino Acids (HIGH PROTEIN PO) Take by mouth.  Marland Kitchen aspirin 81 MG tablet Take 81 mg by mouth daily.      Marland Kitchen atorvastatin (LIPITOR) 40 MG tablet TAKE 1 TABLET BY MOUTH ONCE DAILY.  Marland Kitchen HYDROcodone-acetaminophen (NORCO/VICODIN) 5-325 MG tablet Take 1 tablet by mouth every 6 (six) hours as needed for severe pain.  Marland Kitchen losartan (COZAAR) 50 MG tablet Take 50 mg by mouth daily.  . metoprolol tartrate (LOPRESSOR) 25 MG tablet TAKE (1/2) TABLET BY MOUTH 2 TIMES A DAY.  . Polyvinyl Alcohol-Povidone (CLEAR EYES ALL SEASONS OP) Apply 1 drop to eye daily as needed (red eyes).  . Tamsulosin HCl (FLOMAX) 0.4 MG CAPS Take 0.4 mg by mouth daily after supper.       Allergies:   Patient has no known allergies.   Social History   Tobacco Use  . Smoking status: Former Smoker    Packs/day: 0.50    Years: 4.00    Pack years: 2.00    Types: Cigarettes    Start date: 06/20/1942    Quit date: 12/22/1965    Years since quitting: 53.6  . Smokeless tobacco: Never Used  Substance Use Topics  . Alcohol use: No    Alcohol/week: 0.0 standard drinks  . Drug use: No     Family Hx: The patient's family history includes Heart disease in his father; Stroke in his mother.  ROS:   Please see the history of present illness.     All other systems reviewed and are negative.   Prior CV studies:   The following studies were reviewed today:  NA  Labs/Other Tests and Data Reviewed:    EKG:  No ECG reviewed.  Recent Labs: No results found for requested labs within last 8760 hours.   Recent Lipid Panel Lab Results  Component Value Date/Time   CHOL 174 03/04/2012 08:21 AM   TRIG 58 03/04/2012 08:21 AM   HDL 71 03/04/2012 08:21 AM   CHOLHDL 2.5 03/04/2012 08:21 AM   LDLCALC 91 03/04/2012 08:21 AM    Wt Readings from Last 3 Encounters:  08/08/19 127 lb (57.6 kg)  01/24/19 125 lb (56.7 kg)  05/24/18 135 lb (61.2 kg)     Objective:    Vital Signs:  BP (!) 170/90   Pulse (!) 53   Ht 5\' 4"  (1.626 m)   Wt 127 lb (57.6 kg)   BMI 21.80 kg/m    VITAL SIGNS:  reviewed  ASSESSMENT & PLAN:    1. CAD: History  of CABG. Stable ischemic heart disease. Continue aspirin, atorvastatin, and metoprolol.  2. Essential elevated today, normal at last visit. He thinks it was due to rushing this morning. I asked him to monitor it.  3. Hyperlipidemia: Continue atorvastatin.   COVID-19 Education: The signs and symptoms of COVID-19 were discussed with the patient and how to seek care for testing (follow up with PCP or arrange E-visit).  The importance of social distancing was discussed today.  Time:   Today, I have spent 21 minutes with the patient with telehealth technology discussing the above problems.  Medication Adjustments/Labs and Tests Ordered: Current medicines are reviewed at length with the patient today.  Concerns regarding medicines are outlined above.   Tests Ordered: No orders of the defined types were placed in this encounter.   Medication Changes: No orders of the defined types were placed in this encounter.   Follow Up:  In Person in 6 month(s)  Signed, Prentice Docker, MD  08/08/2019 8:20 AM    [facility]

## 2019-08-08 NOTE — Patient Instructions (Signed)

## 2019-08-31 DIAGNOSIS — I2510 Atherosclerotic heart disease of native coronary artery without angina pectoris: Secondary | ICD-10-CM | POA: Diagnosis not present

## 2019-08-31 DIAGNOSIS — E063 Autoimmune thyroiditis: Secondary | ICD-10-CM | POA: Diagnosis not present

## 2019-08-31 DIAGNOSIS — E7849 Other hyperlipidemia: Secondary | ICD-10-CM | POA: Diagnosis not present

## 2019-08-31 DIAGNOSIS — I10 Essential (primary) hypertension: Secondary | ICD-10-CM | POA: Diagnosis not present

## 2019-09-14 DIAGNOSIS — Z1389 Encounter for screening for other disorder: Secondary | ICD-10-CM | POA: Diagnosis not present

## 2019-09-14 DIAGNOSIS — Z6822 Body mass index (BMI) 22.0-22.9, adult: Secondary | ICD-10-CM | POA: Diagnosis not present

## 2019-09-14 DIAGNOSIS — I10 Essential (primary) hypertension: Secondary | ICD-10-CM | POA: Diagnosis not present

## 2019-09-14 DIAGNOSIS — N210 Calculus in bladder: Secondary | ICD-10-CM | POA: Diagnosis not present

## 2019-09-14 DIAGNOSIS — H02102 Unspecified ectropion of right lower eyelid: Secondary | ICD-10-CM | POA: Diagnosis not present

## 2019-09-14 DIAGNOSIS — R310 Gross hematuria: Secondary | ICD-10-CM | POA: Diagnosis not present

## 2019-09-14 DIAGNOSIS — E063 Autoimmune thyroiditis: Secondary | ICD-10-CM | POA: Diagnosis not present

## 2019-10-21 DIAGNOSIS — H40013 Open angle with borderline findings, low risk, bilateral: Secondary | ICD-10-CM | POA: Diagnosis not present

## 2019-10-21 DIAGNOSIS — H524 Presbyopia: Secondary | ICD-10-CM | POA: Diagnosis not present

## 2019-11-08 ENCOUNTER — Telehealth: Payer: Self-pay | Admitting: Urology

## 2019-11-08 NOTE — Telephone Encounter (Signed)
Pt stated he is having blood in urine. He is not in pain, he just has concerns and would like a call back.

## 2019-11-14 NOTE — Telephone Encounter (Signed)
Scheduled for this Friday

## 2019-11-17 DIAGNOSIS — E039 Hypothyroidism, unspecified: Secondary | ICD-10-CM | POA: Diagnosis not present

## 2019-11-18 ENCOUNTER — Ambulatory Visit: Payer: Medicare Other | Admitting: Urology

## 2019-11-18 ENCOUNTER — Other Ambulatory Visit: Payer: Self-pay

## 2019-11-18 ENCOUNTER — Encounter: Payer: Self-pay | Admitting: Urology

## 2019-11-18 VITALS — BP 164/73 | HR 78 | Temp 97.8°F | Ht 64.0 in | Wt 125.0 lb

## 2019-11-18 DIAGNOSIS — R351 Nocturia: Secondary | ICD-10-CM

## 2019-11-18 DIAGNOSIS — N138 Other obstructive and reflux uropathy: Secondary | ICD-10-CM | POA: Diagnosis not present

## 2019-11-18 DIAGNOSIS — R3912 Poor urinary stream: Secondary | ICD-10-CM | POA: Diagnosis not present

## 2019-11-18 DIAGNOSIS — R972 Elevated prostate specific antigen [PSA]: Secondary | ICD-10-CM

## 2019-11-18 DIAGNOSIS — I700 Atherosclerosis of aorta: Secondary | ICD-10-CM

## 2019-11-18 DIAGNOSIS — N401 Enlarged prostate with lower urinary tract symptoms: Secondary | ICD-10-CM

## 2019-11-18 DIAGNOSIS — N210 Calculus in bladder: Secondary | ICD-10-CM

## 2019-11-18 DIAGNOSIS — N201 Calculus of ureter: Secondary | ICD-10-CM

## 2019-11-18 LAB — POCT URINALYSIS DIPSTICK
Bilirubin, UA: NEGATIVE
Blood, UA: NEGATIVE
Glucose, UA: NEGATIVE
Ketones, UA: NEGATIVE
Leukocytes, UA: NEGATIVE
Nitrite, UA: NEGATIVE
Protein, UA: POSITIVE — AB
Spec Grav, UA: 1.015 (ref 1.010–1.025)
Urobilinogen, UA: 0.2 E.U./dL
pH, UA: 6 (ref 5.0–8.0)

## 2019-11-18 MED ORDER — FINASTERIDE 5 MG PO TABS: 5.0000 mg | ORAL_TABLET | Freq: Every day | ORAL | 3 refills | Status: DC

## 2019-11-18 NOTE — Progress Notes (Signed)
Urological Symptom Review  Patient is experiencing the following symptoms: Weak stream   Review of Systems  Gastrointestinal (upper)  : Negative for upper GI symptoms  Gastrointestinal (lower) : Negative for lower GI symptoms  Constitutional : Negative for symptoms  Skin: Negative for skin symptoms  Eyes: Negative for eye symptoms  Ear/Nose/Throat : Negative for Ear/Nose/Throat symptoms  Hematologic/Lymphatic: Negative for Hematologic/Lymphatic symptoms  Cardiovascular : Negative for cardiovascular symptoms  Respiratory : Negative for respiratory symptoms  Endocrine: Negative for endocrine symptoms  Musculoskeletal: Negative for musculoskeletal symptoms  Neurological: Negative for neurological symptoms  Psychologic: Negative for psychiatric symptoms  

## 2019-11-18 NOTE — Progress Notes (Signed)
Subjective:  1. BPH with urinary obstruction   2. Elevated PSA   3. Weak urinary stream   4. Nocturia   5. Bladder stone   6. Ureteral stone   7. Atherosclerosis of abdominal aorta (HCC)        Brian Solomon: Initially sent by Dr. Sherwood Gambler 9.6.2019 for a history of BPH with BOO with a rising PSA. He has been on tamsulosin for several years. He is voiding with a reduced stream in the morning but then he voids well the rest of the day. He has minimal nocturia but restricts fluids after 4pm. His PSA was 4.7 in 10/16 and was 5.3 in 6/19 and at his advanced age these levels were felt to be acceptable particularly with the size of his prostate.   11/18/19: He was last seen on 11/02/18 by Dr. Retta Diones for microhematuria and increasing LUTS and was found to have a bladder stone that was reported a large but he didn't agree to therapy at that time.  A CT stone study on 11/08/18  demonstrated a few small right renal stones but there was a 5-50mm right UVJ stone and there were two large bladder stones with the largest about 2.2cm.  He has a very large prostate with a prominent intravesical middle lobe.   The hematuria has cleared but he has nocturia 4-5x.  He drinks water, coffee, tea and Mtn Dews.   He has recently cut back on the fluids in the afternoon which helps.   He does well during the day.  He remains on tamsulosin.   He has no flank pain.       IPSS    Row Name 11/18/19 1500         International Prostate Symptom Score   How often have you had the sensation of not emptying your bladder? Almost always     How often have you had to urinate less than every two hours? More than half the time     How often have you found you stopped and started again several times when you urinated? Less than half the time     How often have you found it difficult to postpone urination? Not at All     How often have you had a weak urinary stream? About half the time     How often have you had to strain to start urination?  Not at All     How many times did you typically get up at night to urinate? 3 Times     Total IPSS Score 17             ROS:  ROS:  A complete review of systems was performed.  All systems are negative except for pertinent findings as noted.   ROS  No Known Allergies  Outpatient Encounter Medications as of 11/18/2019  Medication Sig  . Amino Acids (HIGH PROTEIN PO) Take by mouth.  Marland Kitchen aspirin 81 MG tablet Take 81 mg by mouth daily.    Marland Kitchen atorvastatin (LIPITOR) 40 MG tablet TAKE 1 TABLET BY MOUTH ONCE DAILY.  Marland Kitchen levothyroxine (SYNTHROID) 25 MCG tablet Take 25 mcg by mouth daily.  Marland Kitchen losartan (COZAAR) 50 MG tablet Take 50 mg by mouth daily.  . metoprolol tartrate (LOPRESSOR) 25 MG tablet TAKE (1/2) TABLET BY MOUTH 2 TIMES A DAY.  . Polyvinyl Alcohol-Povidone (CLEAR EYES ALL SEASONS OP) Apply 1 drop to eye daily as needed (red eyes).  . Tamsulosin HCl (FLOMAX) 0.4 MG CAPS Take  0.4 mg by mouth daily after supper.    . finasteride (PROSCAR) 5 MG tablet Take 1 tablet (5 mg total) by mouth daily.  . [DISCONTINUED] HYDROcodone-acetaminophen (NORCO/VICODIN) 5-325 MG tablet Take 1 tablet by mouth every 6 (six) hours as needed for severe pain.   No facility-administered encounter medications on file as of 11/18/2019.    Past Medical History:  Diagnosis Date  . Arteriosclerotic cardiovascular disease (ASCVD)    acute IMI in 2004 with subsequent CABG; cath 2009 60% left main; atretic LIMA; occluded SVG to M1; patent grafts to RCA and D1;  EF 40-45% with inferior hypokinesis on echo in 3/11; pharmacologic stress nuclear in 3/11-EF of 51%, mild basilar inferolateral ischemia  . History of myocardial perfusion scan 2009,2011  . Hyperlipidemia    Lipid profile in 2009:159, 57, 72, 76; 2012:176, 53, 76, 89  . Hypertension    CBC and CMet normal in 12/2010  . Tobacco abuse, in remission    Quit in 1978    Past Surgical History:  Procedure Laterality Date  . CARDIAC CATHETERIZATION    . CATARACT  EXTRACTION W/PHACO Right 12/11/2015   Procedure: CATARACT EXTRACTION PHACO AND INTRAOCULAR LENS PLACEMENT (IOC);  Surgeon: Jethro Bolus, MD;  Location: [facility];  Service: Ophthalmology;  Laterality: Right;  CDE: 9.44  . CORONARY ARTERY BYPASS GRAFT  2004  . HERNIA REPAIR      Social History   Socioeconomic History  . Marital status: Widowed    Spouse name: Not on file  . Number of children: 5  . Years of education: Not on file  . Highest education level: Not on file  Occupational History  . Occupation: retired    Comment: [employer]  Tobacco Use  . Smoking status: Former Smoker    Packs/day: 0.50    Years: 4.00    Pack years: 2.00    Types: Cigarettes    Start date: 06/20/1942    Quit date: 12/22/1965    Years since quitting: 53.9  . Smokeless tobacco: Never Used  Vaping Use  . Vaping Use: Never used  Substance and Sexual Activity  . Alcohol use: No    Alcohol/week: 0.0 standard drinks  . Drug use: No  . Sexual activity: Not on file  Other Topics Concern  . Not on file  Social History Narrative  . Not on file   Social Determinants of Health   Financial Resource Strain:   . Difficulty of Paying Living Expenses:   Food Insecurity:   . Worried About Programme researcher, broadcasting/film/video in the Last Year:   . Barista in the Last Year:   Transportation Needs:   . Freight forwarder (Medical):   Marland Kitchen Lack of Transportation (Non-Medical):   Physical Activity:   . Days of Exercise per Week:   . Minutes of Exercise per Session:   Stress:   . Feeling of Stress :   Social Connections:   . Frequency of Communication with Friends and Family:   . Frequency of Social Gatherings with Friends and Family:   . Attends Religious Services:   . Active Member of Clubs or Organizations:   . Attends Banker Meetings:   Marland Kitchen Marital Status:   Intimate Partner Violence:   . Fear of Current or Ex-Partner:   . Emotionally Abused:   Marland Kitchen Physically Abused:   . Sexually Abused:      Family History  Problem Relation Age of Onset  . Heart disease Father  and a brother  . Stroke Mother        and brother       Objective: CLINICAL DATA:  86 year old male with history of blood in his underwear. Microscopic hematuria.  EXAM: CT ABDOMEN AND PELVIS WITHOUT CONTRAST  TECHNIQUE: Multidetector CT imaging of the abdomen and pelvis was performed following the standard protocol without IV contrast.  COMPARISON:  No priors.  FINDINGS: Lower chest: Scattered areas of mild cylindrical bronchiectasis in the visualize lung bases. Aortic atherosclerosis. Atherosclerotic calcifications in the right coronary artery. Status post median sternotomy.  Hepatobiliary: No definite suspicious cystic or solid hepatic lesions are confidently identified on today's noncontrast CT examination. Small calcified granuloma in segment 5 of the liver incidentally noted. Gallbladder is nearly completely contracted, but otherwise unremarkable in appearance.  Pancreas: No definite pancreatic mass or peripancreatic fluid collections or inflammatory changes noted on today's noncontrast CT examination.  Spleen: Multiple small calcified granulomas in the spleen incidentally noted.  Adrenals/Urinary Tract: Multiple small 2-3 mm nonobstructive calculi in the collecting systems of both kidneys. In addition, in the distal third of the right ureter immediately before the right ureterovesicular junction (axial image 60 of series 2) there is a 7 mm calculus. This is not associated with significant proximal hydroureteronephrosis to indicate obstruction at this time. There are 2 additional large calculi in the urinary bladder lying dependently, largest of which is located posterolaterally on the right side measuring 2.0 x 1.6 x 1.8 cm (axial image 60 of series 2). Unenhanced appearance of the urinary bladder is otherwise unremarkable. Bilateral adrenal glands are normal in  appearance.  Stomach/Bowel: Unenhanced appearance of the stomach is normal. No pathologic dilatation of small bowel or colon. Numerous colonic diverticulae are noted, without surrounding inflammatory changes to indicate an acute diverticulitis at this time. Large volume of well-formed stool throughout the colon suggesting constipation.  Vascular/Lymphatic: Aortic atherosclerosis with fusiform aneurysmal dilatation of the infrarenal abdominal aorta which measures up to 3.0 x 3.6 cm. No lymphadenopathy noted in the abdomen or pelvis.  Reproductive: Prostate gland is enlarged with median lobe hypertrophy. Seminal vesicles are unremarkable in appearance.  Other: No significant volume of ascites. No pneumoperitoneum. Mild edema in the mesentery and omentum, best appreciated in the left side of the abdomen on axial image 33 of series 2.  Musculoskeletal: There are no aggressive appearing lytic or blastic lesions noted in the visualized portions of the skeleton. Dextroscoliosis of the lumbar spine with multilevel degenerative disc disease and facet arthropathy.  IMPRESSION: 1. In addition to multiple small 2-3 mm nonobstructive calculi in the collecting systems of both kidneys, there is a 7 mm calculus in the distal third of the right ureter immediately before the right ureterovesicular junction. No associated proximal hydroureteronephrosis to indicate obstruction at this time. There also 2 large bladder calculi, largest of which measures 2.0 x 1.6 x 1.8 cm. 2. Severe prostatomegaly with median lobe hypertrophy of the prostate gland. 3. Mild diffuse mesenteric and omental edema of uncertain etiology and significance. 4. Aortic atherosclerosis, in addition to at least right coronary artery disease. In addition, there is fusiform aneurysmal dilatation of the infrarenal abdominal aorta which measures up to 3.0 x 3.6 cm. Recommend followup by ultrasound in 2 years. This  recommendation follows ACR consensus guidelines: White Paper of the ACR Incidental Findings Committee II on Vascular Findings. J Am Coll Radiol 2013; 10:789-794. 5. Colonic diverticulosis without evidence of acute diverticulitis at this time. 6. Additional incidental findings, as above.  Electronically Signed   By: Trudie Reed M.D.   On: 11/08/2018 13:10   Physical Exam  Lab Results:  Results for orders placed or performed in visit on 11/18/19 (from the past 24 hour(s))  POCT urinalysis dipstick     Status: Abnormal   Collection Time: 11/18/19  2:14 PM  Result Value Ref Range   Color, UA yellow    Clarity, UA     Glucose, UA Negative Negative   Bilirubin, UA neg    Ketones, UA neg    Spec Grav, UA 1.015 1.010 - 1.025   Blood, UA neg    pH, UA 6.0 5.0 - 8.0   Protein, UA Positive (A) Negative   Urobilinogen, UA 0.2 0.2 or 1.0 E.U./dL   Nitrite, UA neg    Leukocytes, UA Negative Negative   Appearance clear    Odor      BMET No results for input(s): NA, K, CL, CO2, GLUCOSE, BUN, CREATININE, CALCIUM in the last 72 hours. PSA No results found for: PSA No results found for: TESTOSTERONE    Studies/Results: CT reviewed.  Prior records reviewed. IPSS reviewed.     Assessment & Plan: BPH with BOO.  He has increased nocturia that may be related to the stones or possibly the coffee, tea and Mtn Dew's so I have asked him to stop that.   He is not interested in prostate surgery.  I discussed options and will get him started on finasteride.  Side effects reviewed.  Bladder stones and right distal stone with small renal stones.   He had no obstruction and has no flank pain.  I will just repeat the CT in 3 months to reassess the stones.    AAA and athetherosclerosis on recent CT.  The AAA is 3.4cm and would just need f/u in about a year.     Meds ordered this encounter  Medications  . finasteride (PROSCAR) 5 MG tablet    Sig: Take 1 tablet (5 mg total) by mouth  daily.    Dispense:  90 tablet    Refill:  3     Orders Placed This Encounter  Procedures  . CT RENAL STONE STUDY    Standing Status:   Future    Standing Expiration Date:   03/19/2020    Order Specific Question:   Preferred imaging location?    Answer:   [facility]    Order Specific Question:   Radiology Contrast Protocol - do NOT remove file path    Answer:   [email or web address]  . POCT urinalysis dipstick      Return in about 3 months (around 02/18/2020) for with CT results. .   CC: Elfredia Nevins, MD      Bjorn Pippin 11/18/2019

## 2019-11-25 ENCOUNTER — Ambulatory Visit: Payer: Medicare Other | Admitting: Urology

## 2019-12-29 DIAGNOSIS — E039 Hypothyroidism, unspecified: Secondary | ICD-10-CM | POA: Diagnosis not present

## 2019-12-29 DIAGNOSIS — I10 Essential (primary) hypertension: Secondary | ICD-10-CM | POA: Diagnosis not present

## 2019-12-29 DIAGNOSIS — Z0001 Encounter for general adult medical examination with abnormal findings: Secondary | ICD-10-CM | POA: Diagnosis not present

## 2019-12-29 DIAGNOSIS — E063 Autoimmune thyroiditis: Secondary | ICD-10-CM | POA: Diagnosis not present

## 2019-12-29 DIAGNOSIS — R5383 Other fatigue: Secondary | ICD-10-CM | POA: Diagnosis not present

## 2019-12-29 DIAGNOSIS — E782 Mixed hyperlipidemia: Secondary | ICD-10-CM | POA: Diagnosis not present

## 2019-12-29 DIAGNOSIS — M5030 Other cervical disc degeneration, unspecified cervical region: Secondary | ICD-10-CM | POA: Diagnosis not present

## 2019-12-29 DIAGNOSIS — Z1389 Encounter for screening for other disorder: Secondary | ICD-10-CM | POA: Diagnosis not present

## 2019-12-29 DIAGNOSIS — Z6822 Body mass index (BMI) 22.0-22.9, adult: Secondary | ICD-10-CM | POA: Diagnosis not present

## 2019-12-29 DIAGNOSIS — E7849 Other hyperlipidemia: Secondary | ICD-10-CM | POA: Diagnosis not present

## 2020-01-19 ENCOUNTER — Encounter: Payer: Self-pay | Admitting: Emergency Medicine

## 2020-01-19 ENCOUNTER — Ambulatory Visit
Admission: EM | Admit: 2020-01-19 | Discharge: 2020-01-19 | Disposition: A | Discharge status: Home / Self Care | Payer: Medicare Other

## 2020-01-19 DIAGNOSIS — H6123 Impacted cerumen, bilateral: Secondary | ICD-10-CM | POA: Diagnosis not present

## 2020-01-19 NOTE — ED Provider Notes (Signed)
[facility]   [id number] 01/19/20 Arrival Time: 1257  Chief Complaint  Patient presents with   Hearing Problem     SUBJECTIVE: History from: patient.  Brian Solomon is a 86 y.o. male who presented to the urgent care with a complaint of difficulty hearing that started yesterday.report he flushed his ear after taking bath and developed the symptom thereafter.  Denies hearing pain.  Denies aggravating or alleviating factors.  Denies similar symptoms in the past.    Denies fever, chills, fatigue, sinus pain, rhinorrhea, ear discharge, sore throat, SOB, wheezing, chest pain, nausea, changes in bowel or bladder habits.    ROS: As per HPI.  All other pertinent ROS negative.     Past Medical History:  Diagnosis Date   Arteriosclerotic cardiovascular disease (ASCVD)    acute IMI in 2004 with subsequent CABG; cath 2009 60% left main; atretic LIMA; occluded SVG to M1; patent grafts to RCA and D1;  EF 40-45% with inferior hypokinesis on echo in 3/11; pharmacologic stress nuclear in 3/11-EF of 51%, mild basilar inferolateral ischemia   History of myocardial perfusion scan 2009,2011   Hyperlipidemia    Lipid profile in 2009:159, 57, 72, 76; 2012:176, 53, 76, 89   Hypertension    CBC and CMet normal in 12/2010   Tobacco abuse, in remission    Quit in 1978   Past Surgical History:  Procedure Laterality Date   CARDIAC CATHETERIZATION     CATARACT EXTRACTION W/PHACO Right 12/11/2015   Procedure: CATARACT EXTRACTION PHACO AND INTRAOCULAR LENS PLACEMENT (IOC);  Surgeon: Jethro Bolus, MD;  Location: [facility];  Service: Ophthalmology;  Laterality: Right;  CDE: 9.44   CORONARY ARTERY BYPASS GRAFT  2004   HERNIA REPAIR     No Known Allergies No current facility-administered medications on file prior to encounter.   Current Outpatient Medications on File Prior to Encounter  Medication Sig Dispense Refill   Amino Acids (HIGH PROTEIN PO) Take by mouth.     aspirin 81 MG tablet  Take 81 mg by mouth daily.       atorvastatin (LIPITOR) 40 MG tablet TAKE 1 TABLET BY MOUTH ONCE DAILY. 90 tablet 3   finasteride (PROSCAR) 5 MG tablet Take 1 tablet (5 mg total) by mouth daily. 90 tablet 3   levothyroxine (SYNTHROID) 25 MCG tablet Take 25 mcg by mouth daily.     losartan (COZAAR) 50 MG tablet Take 50 mg by mouth daily.     metoprolol tartrate (LOPRESSOR) 25 MG tablet TAKE (1/2) TABLET BY MOUTH 2 TIMES A DAY. 180 tablet 3   Polyvinyl Alcohol-Povidone (CLEAR EYES ALL SEASONS OP) Apply 1 drop to eye daily as needed (red eyes).     Tamsulosin HCl (FLOMAX) 0.4 MG CAPS Take 0.4 mg by mouth daily after supper.       Social History   Socioeconomic History   Marital status: Widowed    Spouse name: Not on file   Number of children: 5   Years of education: Not on file   Highest education level: Not on file  Occupational History   Occupation: retired    Comment: [employer]  Tobacco Use   Smoking status: Former Smoker    Packs/day: 0.50    Years: 4.00    Pack years: 2.00    Types: Cigarettes    Start date: 06/20/1942    Quit date: 12/22/1965    Years since quitting: 54.1   Smokeless tobacco: Never Used  Vaping Use   Vaping  Use: Never used  Substance and Sexual Activity   Alcohol use: No    Alcohol/week: 0.0 standard drinks   Drug use: No   Sexual activity: Not on file  Other Topics Concern   Not on file  Social History Narrative   Not on file   Social Determinants of Health   Financial Resource Strain:    Difficulty of Paying Living Expenses: Not on file  Food Insecurity:    Worried About Running Out of Food in the Last Year: Not on file   Ran Out of Food in the Last Year: Not on file  Transportation Needs:    Lack of Transportation (Medical): Not on file   Lack of Transportation (Non-Medical): Not on file  Physical Activity:    Days of Exercise per Week: Not on file   Minutes of Exercise per Session: Not on file  Stress:     Feeling of Stress : Not on file  Social Connections:    Frequency of Communication with Friends and Family: Not on file   Frequency of Social Gatherings with Friends and Family: Not on file   Attends Religious Services: Not on file   Active Member of Clubs or Organizations: Not on file   Attends Banker Meetings: Not on file   Marital Status: Not on file  Intimate Partner Violence:    Fear of Current or Ex-Partner: Not on file   Emotionally Abused: Not on file   Physically Abused: Not on file   Sexually Abused: Not on file   Family History  Problem Relation Age of Onset   Heart disease Father        and a brother   Stroke Mother        and brother    OBJECTIVE:  Vitals:   01/19/20 1319 01/19/20 1320  BP:  (!) 160/79  Pulse:  62  Resp:  19  Temp:  98.3 F (36.8 C)  TempSrc:  Oral  SpO2:  95%  Weight: 125 lb (56.7 kg)   Height: 5\' 4"  (1.626 m)      Physical Exam Vitals and nursing note reviewed.  Constitutional:      General: He is not in acute distress.    Appearance: Normal appearance. He is normal weight. He is not ill-appearing, toxic-appearing or diaphoretic.  HENT:     Head: Normocephalic and atraumatic.     Right Ear: Ear canal and external ear normal. There is impacted cerumen.     Left Ear: Ear canal and external ear normal. There is impacted cerumen.     Ears:     Comments: Post ear lavage: Bilateral TM within normal, hearing returned to normal.  Cardiovascular:     Rate and Rhythm: Normal rate and regular rhythm.     Pulses: Normal pulses.     Heart sounds: Normal heart sounds. No murmur heard.  No friction rub. No gallop.   Pulmonary:     Effort: Pulmonary effort is normal. No respiratory distress.     Breath sounds: Normal breath sounds. No stridor. No wheezing, rhonchi or rales.  Chest:     Chest wall: No tenderness.  Neurological:     Mental Status: He is alert and oriented to person, place, and time.       Imaging: No results found.   ASSESSMENT & PLAN:  1. Bilateral impacted cerumen     No orders of the defined types were placed in this encounter.  Patient is stable at discharge.  Partial wax remained in the left ear as patient reported pain.  Ear lavage was stopped.   Discharge instruction Rest and drink plenty of fluids Continue to use OTC ibuprofen and/ or tylenol as needed for pain control Follow up with PCP if symptoms persists Return here or go to the ER if you have any new or worsening symptoms   Reviewed expectations re: course of current medical issues. Questions answered. Outlined signs and symptoms indicating need for more acute intervention. Patient verbalized understanding. After Visit Summary given.      Note: This document was prepared using Dragon voice recognition software and may include unintentional dictation errors.   Durward Parcel, FNP 01/19/20 1429

## 2020-01-19 NOTE — ED Triage Notes (Signed)
Pt states he has flushed out his ears in the shower for years.  States after he did it yesterday he feels like he has lost some of his hearing, he also reports some pain to RT ear.

## 2020-01-19 NOTE — Discharge Instructions (Signed)
Rest and drink plenty of fluids Continue to use OTC ibuprofen and/ or tylenol as needed for pain control Follow up with PCP if symptoms persists Return here or go to the ER if you have any new or worsening symptom

## 2020-01-25 ENCOUNTER — Other Ambulatory Visit: Payer: Self-pay | Admitting: *Deleted

## 2020-01-25 MED ORDER — METOPROLOL TARTRATE 25 MG PO TABS: ORAL_TABLET | ORAL | 0 refills | Status: DC

## 2020-01-31 DIAGNOSIS — E7849 Other hyperlipidemia: Secondary | ICD-10-CM | POA: Diagnosis not present

## 2020-01-31 DIAGNOSIS — E063 Autoimmune thyroiditis: Secondary | ICD-10-CM | POA: Diagnosis not present

## 2020-01-31 DIAGNOSIS — I2510 Atherosclerotic heart disease of native coronary artery without angina pectoris: Secondary | ICD-10-CM | POA: Diagnosis not present

## 2020-01-31 DIAGNOSIS — I10 Essential (primary) hypertension: Secondary | ICD-10-CM | POA: Diagnosis not present

## 2020-02-08 ENCOUNTER — Encounter: Payer: Self-pay | Admitting: Cardiology

## 2020-02-08 ENCOUNTER — Ambulatory Visit: Payer: Medicare Other | Admitting: Cardiovascular Disease

## 2020-02-08 NOTE — Progress Notes (Signed)
Cardiology Office Note  Date: 02/09/2020   ID: Brian Solomon, DOB [birth year], MRN [id number]  PCP:  Elfredia Nevins, MD  Cardiologist:  Nona Dell, MD Electrophysiologist:  None   Chief Complaint  Patient presents with  . Cardiac follow-up    History of Present Illness: Brian Solomon is a 86 y.o. male former patient of Dr. Purvis Sheffield presenting to establish follow-up with me.  I reviewed his records and updated the chart.  He was last assessed via telehealth encounter back in March.  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  His wife passed away about a year ago, they had been married for 72 years.  He does not describe any progressive angina symptoms or limiting breathlessness on medical therapy.  He continues to follow with Robbie Lis, we are requesting his recent lab work.  He has not undergone any recent cardiac structural or ischemic testing, managed conservatively by Dr. Purvis Sheffield on medical therapy in the absence of angina symptoms.  I personally reviewed his ECG today which shows sinus bradycardia with possible old inferior infarct pattern.  Past Medical History:  Diagnosis Date  . CAD (coronary artery disease)    Acute IMI in 2004 with subsequent CABG; cath 2009 60% left main; atretic LIMA; occluded SVG to M1; patent grafts to RCA and D1  . Essential hypertension   . Hyperlipidemia     Past Surgical History:  Procedure Laterality Date  . CARDIAC CATHETERIZATION    . CATARACT EXTRACTION W/PHACO Right 12/11/2015   Procedure: CATARACT EXTRACTION PHACO AND INTRAOCULAR LENS PLACEMENT (IOC);  Surgeon: Jethro Bolus, MD;  Location: [facility];  Service: Ophthalmology;  Laterality: Right;  CDE: 9.44  . CORONARY ARTERY BYPASS GRAFT  2004  . HERNIA REPAIR      Current Outpatient Medications  Medication Sig Dispense Refill  . Amino Acids (HIGH PROTEIN PO) Take by  mouth.    Marland Kitchen aspirin 81 MG tablet Take 81 mg by mouth daily.      Marland Kitchen atorvastatin (LIPITOR) 40 MG tablet TAKE 1 TABLET BY MOUTH ONCE DAILY. 90 tablet 3  . finasteride (PROSCAR) 5 MG tablet Take 1 tablet (5 mg total) by mouth daily. 90 tablet 3  . levothyroxine (SYNTHROID) 25 MCG tablet Take 25 mcg by mouth daily.    Marland Kitchen losartan (COZAAR) 50 MG tablet Take 50 mg by mouth daily.    . metoprolol tartrate (LOPRESSOR) 25 MG tablet TAKE (1/2) TABLET BY MOUTH 2 TIMES A DAY. 90 tablet 0  . Polyvinyl Alcohol-Povidone (CLEAR EYES ALL SEASONS OP) Apply 1 drop to eye daily as needed (red eyes).    . Tamsulosin HCl (FLOMAX) 0.4 MG CAPS Take 0.4 mg by mouth daily after supper.       No current facility-administered medications for this visit.   Allergies:  Patient has no known allergies.   ROS:   No palpitations or syncope.  Physical Exam: VS:  BP 122/74   Pulse (!) 59   Ht 5\' 4"  (1.626 m)   Wt 127 lb (57.6 kg)   SpO2 97%   BMI 21.80 kg/m , BMI Body mass index is 21.8 kg/m.  Wt Readings from Last 3 Encounters:  02/09/20 127 lb (57.6 kg)  01/19/20 125 lb (56.7 kg)  11/18/19 125 lb (56.7 kg)    General: Elderly male, appears comfortable at rest. HEENT: Conjunctiva and lids normal, wearing a mask.  Neck: Supple, no elevated JVP or carotid bruits, no thyromegaly. Lungs: Clear to auscultation, nonlabored breathing at rest. Cardiac: Regular rate and rhythm, no S3, soft systolic murmur, no pericardial rub. Extremities: No pitting edema, distal pulses 2+.  ECG:  An ECG dated 03/24/2018 was personally reviewed today and demonstrated:  Sinus rhythm with old inferior infarct pattern, PVCs.  Recent Labwork:  No interval lab work for review today.  Other Studies Reviewed Today:  No interval cardiac testing for review today.  Assessment and Plan:  1.  Multivessel CAD status post CABG in 2004 with documented graft disease as of 2009 that has been managed medically.  Fortunately, he continues to do  quite well with no recurrent angina on medical therapy.  I reviewed his ECG today.  Continue aspirin, Lipitor, Cozaar, and Lopressor.  2.  Mixed hyperlipidemia, he remains on Lipitor.  Requesting interval lab work from [employer].  3.  Essential hypertension by history, blood pressure is well controlled today.  Continue losartan and Lopressor.  Medication Adjustments/Labs and Tests Ordered: Current medicines are reviewed at length with the patient today.  Concerns regarding medicines are outlined above.   Tests Ordered: Orders Placed This Encounter  Procedures  . EKG 12-Lead    Medication Changes: No orders of the defined types were placed in this encounter.   Disposition:  Follow up 1 year in the [place] office.  Signed, Jonelle Sidle, MD, [facility] 02/09/2020 1:35 PM    [facility] at [facility] [address], [place], [place] [address] Phone: [phone]; Fax: [phone]

## 2020-02-09 ENCOUNTER — Other Ambulatory Visit: Payer: Self-pay

## 2020-02-09 ENCOUNTER — Encounter: Payer: Self-pay | Admitting: *Deleted

## 2020-02-09 ENCOUNTER — Ambulatory Visit (INDEPENDENT_AMBULATORY_CARE_PROVIDER_SITE_OTHER): Payer: Medicare Other | Admitting: Cardiology

## 2020-02-09 ENCOUNTER — Encounter: Payer: Self-pay | Admitting: Cardiology

## 2020-02-09 VITALS — BP 122/74 | HR 59 | Ht 64.0 in | Wt 127.0 lb

## 2020-02-09 DIAGNOSIS — E782 Mixed hyperlipidemia: Secondary | ICD-10-CM

## 2020-02-09 DIAGNOSIS — I10 Essential (primary) hypertension: Secondary | ICD-10-CM

## 2020-02-09 DIAGNOSIS — I25119 Atherosclerotic heart disease of native coronary artery with unspecified angina pectoris: Secondary | ICD-10-CM | POA: Diagnosis not present

## 2020-02-09 NOTE — Patient Instructions (Signed)
Medication Instructions:  Your physician recommends that you continue on your current medications as directed. Please refer to the Current Medication list given to you today.  *If you need a refill on your cardiac medications before your next appointment, please call your pharmacy*   Lab Work: NONE   If you have labs (blood work) drawn today and your tests are completely normal, you will receive your results only by: MyChart Message (if you have MyChart) OR A paper copy in the mail If you have any lab test that is abnormal or we need to change your treatment, we will call you to review the results.   Testing/Procedures: NONE    Follow-Up: At [facility], you and your health needs are our priority.  As part of our continuing mission to provide you with exceptional heart care, we have created designated Provider Care Teams.  These Care Teams include your primary Cardiologist (physician) and Advanced Practice Providers (APPs -  Physician Assistants and Nurse Practitioners) who all work together to provide you with the care you need, when you need it.  We recommend signing up for the patient portal called "MyChart".  Sign up information is provided on this After Visit Summary.  MyChart is used to connect with patients for Virtual Visits (Telemedicine).  Patients are able to view lab/test results, encounter notes, upcoming appointments, etc.  Non-urgent messages can be sent to your provider as well.   To learn more about what you can do with MyChart, go to [email or web address].    Your next appointment:   1 year(s)  The format for your next appointment:   In Person  Provider:   Samuel McDowell, MD   Other Instructions Thank you for choosing [facility]!    

## 2020-02-16 NOTE — Progress Notes (Signed)
Subjective:  1. BPH with urinary obstruction   2. Weak urinary stream   3. Nocturia   4. Elevated PSA   5. Erectile dysfunction due to arterial insufficiency   6. Bladder stone   7. Ureteral stone        [name]: Initially sent by Dr. Sherwood Gambler 9.6.2019 for a history of BPH with BOO with a rising PSA. He has been on tamsulosin for several years. He is voiding with a reduced stream in the morning but then he voids well the rest of the day. He has minimal nocturia but restricts fluids after 4pm. His PSA was 4.7 in 10/16 and was 5.3 in 6/19 and at his advanced age these levels were felt to be acceptable particularly with the size of his prostate.   11/18/19: He was last seen on 11/02/18 by Dr. Retta Diones for microhematuria and increasing LUTS and was found to have a bladder stone that was reported a large but he didn't agree to therapy at that time.  A CT stone study on 11/08/18  demonstrated a few small right renal stones but there was a 5-52mm right UVJ stone and there were two large bladder stones with the largest about 2.2cm.  He has a very large prostate with a prominent intravesical middle lobe.   The hematuria has cleared but he has nocturia 4-5x.  He drinks water, coffee, tea and Mtn Dews.   He has recently cut back on the fluids in the afternoon which helps.   He does well during the day.  He remains on tamsulosin.   He has no flank pain.    02/17/20:  Barry Dienes returns today in f/u.  He is on finasteride and tamsulosin and is content with the results.  He has minimal nocturia and a good stream.  He feels he empties well.  He has no dysuria or hematuria despite 2 large bladder stones and a RUVJ stone.   He has had no flank pain.  He has 2+ blood in the urine today.  He is interested in discussed ED treatment.      ROS:  ROS:  A complete review of systems was performed.  All systems are negative except for pertinent findings as noted.   ROS  No Known Allergies  Outpatient Encounter Medications as  of 02/17/2020  Medication Sig  . Amino Acids (HIGH PROTEIN PO) Take by mouth.  Marland Kitchen aspirin 81 MG tablet Take 81 mg by mouth daily.    Marland Kitchen atorvastatin (LIPITOR) 40 MG tablet TAKE 1 TABLET BY MOUTH ONCE DAILY.  . finasteride (PROSCAR) 5 MG tablet Take 1 tablet (5 mg total) by mouth daily.  Marland Kitchen levothyroxine (SYNTHROID) 25 MCG tablet Take 25 mcg by mouth daily.  Marland Kitchen losartan (COZAAR) 50 MG tablet Take 50 mg by mouth daily.  . metoprolol tartrate (LOPRESSOR) 25 MG tablet TAKE (1/2) TABLET BY MOUTH 2 TIMES A DAY.  . Polyvinyl Alcohol-Povidone (CLEAR EYES ALL SEASONS OP) Apply 1 drop to eye daily as needed (red eyes).  . Tamsulosin HCl (FLOMAX) 0.4 MG CAPS Take 0.4 mg by mouth daily after supper.    . sildenafil (REVATIO) 20 MG tablet 1-5 tabs po prn 1 hour prior to anticipated activity.  Start with 2 tabs.   No facility-administered encounter medications on file as of 02/17/2020.    Past Medical History:  Diagnosis Date  . CAD (coronary artery disease)    Acute IMI in 2004 with subsequent CABG; cath 2009 60% left main; atretic LIMA; occluded  SVG to M1; patent grafts to RCA and D1  . Essential hypertension   . Hyperlipidemia     Past Surgical History:  Procedure Laterality Date  . CARDIAC CATHETERIZATION    . CATARACT EXTRACTION W/PHACO Right 12/11/2015   Procedure: CATARACT EXTRACTION PHACO AND INTRAOCULAR LENS PLACEMENT (IOC);  Surgeon: Jethro Bolus, MD;  Location: [facility];  Service: Ophthalmology;  Laterality: Right;  CDE: 9.44  . CORONARY ARTERY BYPASS GRAFT  2004  . HERNIA REPAIR      Social History   Socioeconomic History  . Marital status: Widowed    Spouse name: Not on file  . Number of children: 5  . Years of education: Not on file  . Highest education level: Not on file  Occupational History  . Occupation: retired    Comment: [employer]  Tobacco Use  . Smoking status: Former Smoker    Packs/day: 0.50    Years: 4.00    Pack years: 2.00    Types: Cigarettes    Start  date: 06/20/1942    Quit date: 12/22/1965    Years since quitting: 54.1  . Smokeless tobacco: Never Used  Vaping Use  . Vaping Use: Never used  Substance and Sexual Activity  . Alcohol use: No    Alcohol/week: 0.0 standard drinks  . Drug use: No  . Sexual activity: Not on file  Other Topics Concern  . Not on file  Social History Narrative  . Not on file   Social Determinants of Health   Financial Resource Strain:   . Difficulty of Paying Living Expenses: Not on file  Food Insecurity:   . Worried About Programme researcher, broadcasting/film/video in the Last Year: Not on file  . Ran Out of Food in the Last Year: Not on file  Transportation Needs:   . Lack of Transportation (Medical): Not on file  . Lack of Transportation (Non-Medical): Not on file  Physical Activity:   . Days of Exercise per Week: Not on file  . Minutes of Exercise per Session: Not on file  Stress:   . Feeling of Stress : Not on file  Social Connections:   . Frequency of Communication with Friends and Family: Not on file  . Frequency of Social Gatherings with Friends and Family: Not on file  . Attends Religious Services: Not on file  . Active Member of Clubs or Organizations: Not on file  . Attends Banker Meetings: Not on file  . Marital Status: Not on file  Intimate Partner Violence:   . Fear of Current or Ex-Partner: Not on file  . Emotionally Abused: Not on file  . Physically Abused: Not on file  . Sexually Abused: Not on file    Family History  Problem Relation Age of Onset  . Heart disease Father        and a brother  . Stroke Mother        and brother       Objective:   Physical Exam  Lab Results:  Results for orders placed or performed in visit on 02/17/20 (from the past 24 hour(s))  Urinalysis, Routine w reflex microscopic     Status: Abnormal   Collection Time: 02/17/20  1:49 PM  Result Value Ref Range   Specific Gravity, UA 1.015 1.005 - 1.030   pH, UA 5.0 5.0 - 7.5   Color, UA Yellow  Yellow   Appearance Ur Clear Clear   Leukocytes,UA Negative Negative   Protein,UA Negative Negative/Trace  Glucose, UA Negative Negative   Ketones, UA Negative Negative   RBC, UA 2+ (A) Negative   Bilirubin, UA Negative Negative   Urobilinogen, Ur 0.2 0.2 - 1.0 mg/dL   Nitrite, UA Negative Negative   Microscopic Examination See below:    Narrative   Performed at:  [address] - [facility] [address], [place], [place]  [id number] Lab Director: Chinita Pester MT, Phone:  [phone]  Microscopic Examination     Status: Abnormal   Collection Time: 02/17/20  1:49 PM   Urine  Result Value Ref Range   WBC, UA 0-5 0 - 5 /hpf   RBC 3-10 (A) 0 - 2 /hpf   Epithelial Cells (non renal) 0-10 0 - 10 /hpf   Renal Epithel, UA None seen None seen /hpf   Bacteria, UA None seen None seen/Few   Narrative   Performed at:  [address] - [facility] [address] [place], [place]  [id number] Lab Director: Chinita Pester MT, Phone:  [phone]    BMET No results for input(s): NA, K, CL, CO2, GLUCOSE, BUN, CREATININE, CALCIUM in the last 72 hours. PSA No results found for: PSA No results found for: TESTOSTERONE    Studies/Results: UA reviewed.  3-10 RBC's.     Assessment & Plan: BPH with BOO.  He is doing well on finasteride and tamsulosin and will continue that med.  Bladder stones and right distal stone with small renal stones.   He had no obstruction and has no flank pain.  He didn't get the CT I had ordered for prior to this visit but he has no worrisome symptoms.  I will get a KUB prior to f/u in 6 months.  ED.   I discussed options and will try him on sildenafil with a starting dose of 40mg .  Side effects and precautions reviewed.  He will space the dose 4 hours out from the tamsulosin..       Meds ordered this encounter  Medications  . sildenafil (REVATIO) 20 MG tablet    Sig: 1-5 tabs po prn 1 hour prior to anticipated activity.  Start with 2 tabs.    Dispense:  30  tablet    Refill:  11     Orders Placed This Encounter  Procedures  . Microscopic Examination  . Abdomen 1 view (KUB)    Standing Status:   Future    Standing Expiration Date:   06/18/2020    Order Specific Question:   Reason for Exam (SYMPTOM  OR DIAGNOSIS REQUIRED)    Answer:   bladder and ureteral stones    Order Specific Question:   Preferred imaging location?    Answer:   [facility]    Order Specific Question:   Radiology Contrast Protocol - do NOT remove file path    Answer:   [email or web address]  . Urinalysis, Routine w reflex microscopic      Return in about 6 months (around 08/16/2020) for with KUB and renal 08/18/2020. .   CC: [place], MD      Elfredia Nevins 02/17/2020

## 2020-02-17 ENCOUNTER — Other Ambulatory Visit: Payer: Self-pay

## 2020-02-17 ENCOUNTER — Encounter: Payer: Self-pay | Admitting: Urology

## 2020-02-17 ENCOUNTER — Ambulatory Visit (INDEPENDENT_AMBULATORY_CARE_PROVIDER_SITE_OTHER): Payer: Medicare Other | Admitting: Urology

## 2020-02-17 VITALS — BP 159/81 | HR 60 | Temp 98.4°F | Ht 64.0 in | Wt 127.0 lb

## 2020-02-17 DIAGNOSIS — R972 Elevated prostate specific antigen [PSA]: Secondary | ICD-10-CM | POA: Diagnosis not present

## 2020-02-17 DIAGNOSIS — N401 Enlarged prostate with lower urinary tract symptoms: Secondary | ICD-10-CM | POA: Diagnosis not present

## 2020-02-17 DIAGNOSIS — R351 Nocturia: Secondary | ICD-10-CM

## 2020-02-17 DIAGNOSIS — N210 Calculus in bladder: Secondary | ICD-10-CM

## 2020-02-17 DIAGNOSIS — R3912 Poor urinary stream: Secondary | ICD-10-CM | POA: Diagnosis not present

## 2020-02-17 DIAGNOSIS — N201 Calculus of ureter: Secondary | ICD-10-CM

## 2020-02-17 DIAGNOSIS — N5201 Erectile dysfunction due to arterial insufficiency: Secondary | ICD-10-CM

## 2020-02-17 DIAGNOSIS — N138 Other obstructive and reflux uropathy: Secondary | ICD-10-CM

## 2020-02-17 LAB — URINALYSIS, ROUTINE W REFLEX MICROSCOPIC
Bilirubin, UA: NEGATIVE
Glucose, UA: NEGATIVE
Ketones, UA: NEGATIVE
Leukocytes,UA: NEGATIVE
Nitrite, UA: NEGATIVE
Protein,UA: NEGATIVE
Specific Gravity, UA: 1.015 (ref 1.005–1.030)
Urobilinogen, Ur: 0.2 mg/dL (ref 0.2–1.0)
pH, UA: 5 (ref 5.0–7.5)

## 2020-02-17 LAB — MICROSCOPIC EXAMINATION
Bacteria, UA: NONE SEEN
Renal Epithel, UA: NONE SEEN /HPF

## 2020-02-17 MED ORDER — SILDENAFIL CITRATE 20 MG PO TABS: ORAL_TABLET | ORAL | 11 refills | Status: DC

## 2020-02-17 NOTE — Progress Notes (Signed)
Urological Symptom Review  Patient is experiencing the following symptoms: Erection problems (male only)   Review of Systems  Gastrointestinal (upper)  : Negative for upper GI symptoms  Gastrointestinal (lower) : Negative for lower GI symptoms  Constitutional : Night Sweats Weight loss  Skin: Negative for skin symptoms  Eyes: Double vision  Ear/Nose/Throat : Negative for Ear/Nose/Throat symptoms  Hematologic/Lymphatic: Negative for Hematologic/Lymphatic symptoms  Cardiovascular : Leg swelling  Respiratory : Negative for respiratory symptoms  Endocrine: Negative for endocrine symptoms  Musculoskeletal: Negative for musculoskeletal symptoms  Neurological: Negative for neurological symptoms  Psychologic: Negative for psychiatric symptoms

## 2020-02-20 ENCOUNTER — Other Ambulatory Visit: Payer: Self-pay

## 2020-02-20 ENCOUNTER — Telehealth: Payer: Self-pay

## 2020-02-20 DIAGNOSIS — N5201 Erectile dysfunction due to arterial insufficiency: Secondary | ICD-10-CM

## 2020-02-20 MED ORDER — TADALAFIL 20 MG PO TABS: 20.0000 mg | ORAL_TABLET | Freq: Every day | ORAL | 3 refills | Status: DC | PRN

## 2020-02-20 NOTE — Telephone Encounter (Signed)
We could have him try tadalafil 20mg  po qday prn, #6 with refills and if that by itself doesn't work he could take 1/2 of the tadalafil 20mg  and 3 of the 20mg  sildenafil in combination to see if that would help.

## 2020-02-20 NOTE — Progress Notes (Unsigned)
Rx filled per Dr. Annabell Howells. Attempted to call pt. Unable to leave msg.

## 2020-02-21 NOTE — Telephone Encounter (Signed)
Pt notified. Instructions on how to take med were gone over also.

## 2020-02-22 ENCOUNTER — Telehealth: Payer: Self-pay

## 2020-02-23 ENCOUNTER — Other Ambulatory Visit: Payer: Self-pay

## 2020-02-23 MED ORDER — TADALAFIL 5 MG PO TABS: ORAL_TABLET | ORAL | 0 refills | Status: DC

## 2020-02-23 NOTE — Telephone Encounter (Signed)
He was given sildenafil and if that isn't working, we could send a script for tadalafil 5mg  #30 and he could try 2-4 of those tablets alone 1 hour prior to activity or if that isn't effective, he could try 2-3 of the 20mg  sildenafil and 2 of the 5mg  tadalafil together.  If that doesn't work he will need to come in to discuss further options if he is interested.

## 2020-02-23 NOTE — Progress Notes (Signed)
Talked with pt. Told him there was a new med sent in (Tadalafil 5 mg) and the 1st instructions would be on the bottle and to try this first. Then if this did not work to then try the 2nd set of instructions. I went over them several times until pt read these back to me accurately. He expressed understanding.

## 2020-03-28 ENCOUNTER — Other Ambulatory Visit: Payer: Self-pay | Admitting: *Deleted

## 2020-03-28 MED ORDER — ATORVASTATIN CALCIUM 40 MG PO TABS
40.0000 mg | ORAL_TABLET | Freq: Every day | ORAL | 1 refills | Status: DC
Start: 2020-03-28 — End: 2020-10-26

## 2020-05-28 ENCOUNTER — Other Ambulatory Visit: Payer: Self-pay | Admitting: Student

## 2020-06-01 DIAGNOSIS — E063 Autoimmune thyroiditis: Secondary | ICD-10-CM | POA: Diagnosis not present

## 2020-06-01 DIAGNOSIS — I10 Essential (primary) hypertension: Secondary | ICD-10-CM | POA: Diagnosis not present

## 2020-06-01 DIAGNOSIS — E7849 Other hyperlipidemia: Secondary | ICD-10-CM | POA: Diagnosis not present

## 2020-06-01 DIAGNOSIS — I2510 Atherosclerotic heart disease of native coronary artery without angina pectoris: Secondary | ICD-10-CM | POA: Diagnosis not present

## 2020-06-28 ENCOUNTER — Other Ambulatory Visit: Payer: Self-pay | Admitting: Student

## 2020-07-30 DIAGNOSIS — I10 Essential (primary) hypertension: Secondary | ICD-10-CM | POA: Diagnosis not present

## 2020-07-30 DIAGNOSIS — Z6821 Body mass index (BMI) 21.0-21.9, adult: Secondary | ICD-10-CM | POA: Diagnosis not present

## 2020-07-30 DIAGNOSIS — E063 Autoimmune thyroiditis: Secondary | ICD-10-CM | POA: Diagnosis not present

## 2020-07-30 DIAGNOSIS — K5900 Constipation, unspecified: Secondary | ICD-10-CM | POA: Diagnosis not present

## 2020-08-15 NOTE — Progress Notes (Signed)
Subjective:  1. BPH with urinary obstruction   2. Nocturia   3. Bladder stone   4. Ureteral stone   5. Erectile dysfunction due to arterial insufficiency        [name]: Initially sent by Dr. Sherwood Gambler 9.6.2019 for a history of BPH with BOO with a rising PSA. He has been on tamsulosin for several years. He is voiding with a reduced stream in the morning but then he voids well the rest of the day. He has minimal nocturia but restricts fluids after 4pm. His PSA was 4.7 in 10/16 and was 5.3 in 6/19 and at his advanced age these levels were felt to be acceptable particularly with the size of his prostate.   11/18/19: He was last seen on 11/02/18 by Dr. Retta Diones for microhematuria and increasing LUTS and was found to have a bladder stone that was reported a large but he didn't agree to therapy at that time.  A CT stone study on 11/08/18  demonstrated a few small right renal stones but there was a 5-37mm right UVJ stone and there were two large bladder stones with the largest about 2.2cm.  He has a very large prostate with a prominent intravesical middle lobe.   The hematuria has cleared but he has nocturia 4-5x.  He drinks water, coffee, tea and Mtn Dews.   He has recently cut back on the fluids in the afternoon which helps.   He does well during the day.  He remains on tamsulosin.   He has no flank pain.    02/17/20:  Barry Dienes returns today in f/u.  He is on finasteride and tamsulosin and is content with the results.  He has minimal nocturia and a good stream.  He feels he empties well.  He has no dysuria or hematuria despite 2 large bladder stones and a RUVJ stone.   He has had no flank pain.  He has 2+ blood in the urine today.  He is interested in discussed ED treatment.    08/16/20: Lum returns today in f/u.  He remainsn on finasteride and tamsulosin and is voiding well with an IPSS of 7.  He has nocturia x 2 and some frequency.   He has 11-30 RBC's today.  He has bladder and right ureteral stones but declined  therapy.  He has ED and has responded to tadalafil and sildenafil.    ROS:  ROS:  A complete review of systems was performed.  All systems are negative except for pertinent findings as noted.   ROS  No Known Allergies  Outpatient Encounter Medications as of 08/16/2020  Medication Sig  . Amino Acids (HIGH PROTEIN PO) Take by mouth.  Marland Kitchen aspirin 81 MG tablet Take 81 mg by mouth daily.  Marland Kitchen atorvastatin (LIPITOR) 40 MG tablet Take 1 tablet (40 mg total) by mouth daily.  . finasteride (PROSCAR) 5 MG tablet Take 1 tablet (5 mg total) by mouth daily.  Marland Kitchen levothyroxine (SYNTHROID) 25 MCG tablet Take 25 mcg by mouth daily.  Marland Kitchen losartan (COZAAR) 50 MG tablet Take 50 mg by mouth daily.  . metoprolol tartrate (LOPRESSOR) 25 MG tablet TAKE (1/2) TABLET BY MOUTH 2 TIMES A DAY.  . Polyvinyl Alcohol-Povidone (CLEAR EYES ALL SEASONS OP) Apply 1 drop to eye daily as needed (red eyes).  . sildenafil (REVATIO) 20 MG tablet 1-5 tabs po prn 1 hour prior to anticipated activity.  Start with 2 tabs.  . tadalafil (CIALIS) 20 MG tablet Take 1 tablet (20 mg  total) by mouth daily as needed for erectile dysfunction. Try tadalafil 20mg  po qday prn. If that by itself doesn't work you could take 1/2 of the tadalafil 20mg  and 3 of the 20mg  sildenafil in combination to see if that would help.  . tadalafil (CIALIS) 5 MG tablet Take 2 to 4 tablets 1 hr prior to activity  . Tamsulosin HCl (FLOMAX) 0.4 MG CAPS Take 0.4 mg by mouth daily after supper.   No facility-administered encounter medications on file as of 08/16/2020.    Past Medical History:  Diagnosis Date  . CAD (coronary artery disease)    Acute IMI in 2004 with subsequent CABG; cath 2009 60% left main; atretic LIMA; occluded SVG to M1; patent grafts to RCA and D1  . Essential hypertension   . Hyperlipidemia     Past Surgical History:  Procedure Laterality Date  . CARDIAC CATHETERIZATION    . CATARACT EXTRACTION W/PHACO Right 12/11/2015   Procedure: CATARACT  EXTRACTION PHACO AND INTRAOCULAR LENS PLACEMENT (IOC);  Surgeon: 2005, MD;  Location: [facility];  Service: Ophthalmology;  Laterality: Right;  CDE: 9.44  . CORONARY ARTERY BYPASS GRAFT  2004  . HERNIA REPAIR      Social History   Socioeconomic History  . Marital status: Widowed    Spouse name: Not on file  . Number of children: 5  . Years of education: Not on file  . Highest education level: Not on file  Occupational History  . Occupation: retired    Comment: [employer]  Tobacco Use  . Smoking status: Former Smoker    Packs/day: 0.50    Years: 4.00    Pack years: 2.00    Types: Cigarettes    Start date: 06/20/1942    Quit date: 12/22/1965    Years since quitting: 54.6  . Smokeless tobacco: Never Used  Vaping Use  . Vaping Use: Never used  Substance and Sexual Activity  . Alcohol use: No    Alcohol/week: 0.0 standard drinks  . Drug use: No  . Sexual activity: Not on file  Other Topics Concern  . Not on file  Social History Narrative  . Not on file   Social Determinants of Health   Financial Resource Strain: Not on file  Food Insecurity: Not on file  Transportation Needs: Not on file  Physical Activity: Not on file  Stress: Not on file  Social Connections: Not on file  Intimate Partner Violence: Not on file    Family History  Problem Relation Age of Onset  . Heart disease Father        and a brother  . Stroke Mother        and brother       Objective:   Physical Exam  Lab Results:  No results found for this or any previous visit (from the past 24 hour(s)).  BMET No results for input(s): NA, K, CL, CO2, GLUCOSE, BUN, CREATININE, CALCIUM in the last 72 hours. PSA No results found for: PSA No results found for: TESTOSTERONE    Studies/Results: UA reviewed.  3-10 RBC's.     Assessment & Plan: BPH with BOO.  He is doing well on finasteride and tamsulosin. Finasteride refilled.   Bladder stones and right distal stone with small renal  stones.   He had no obstruction and has no flank pain.  He didn't get the CT I had ordered for prior to this visit but he has no worrisome symptoms.  I will get a  KUB prior to f/u in 6 months.  ED.  He will continue the sildenafil and tadalafil as needed.     No orders of the defined types were placed in this encounter.    Orders Placed This Encounter  Procedures  . Urinalysis, Routine w reflex microscopic      Return in about 6 months (around 02/16/2021) for with KUB.   CC: Elfredia Nevins, MD      Bjorn Pippin 08/16/2020

## 2020-08-16 ENCOUNTER — Ambulatory Visit (INDEPENDENT_AMBULATORY_CARE_PROVIDER_SITE_OTHER): Payer: Medicare Other | Admitting: Urology

## 2020-08-16 ENCOUNTER — Other Ambulatory Visit: Payer: Self-pay

## 2020-08-16 ENCOUNTER — Encounter: Payer: Self-pay | Admitting: Urology

## 2020-08-16 VITALS — BP 125/69 | HR 68 | Temp 98.8°F | Ht 64.0 in | Wt 120.0 lb

## 2020-08-16 DIAGNOSIS — N138 Other obstructive and reflux uropathy: Secondary | ICD-10-CM | POA: Diagnosis not present

## 2020-08-16 DIAGNOSIS — R351 Nocturia: Secondary | ICD-10-CM | POA: Diagnosis not present

## 2020-08-16 DIAGNOSIS — N201 Calculus of ureter: Secondary | ICD-10-CM | POA: Diagnosis not present

## 2020-08-16 DIAGNOSIS — N210 Calculus in bladder: Secondary | ICD-10-CM

## 2020-08-16 DIAGNOSIS — N401 Enlarged prostate with lower urinary tract symptoms: Secondary | ICD-10-CM

## 2020-08-16 DIAGNOSIS — N5201 Erectile dysfunction due to arterial insufficiency: Secondary | ICD-10-CM

## 2020-08-16 MED ORDER — FINASTERIDE 5 MG PO TABS
5.0000 mg | ORAL_TABLET | Freq: Every day | ORAL | 3 refills | Status: DC
Start: 2020-08-16 — End: 2021-02-14

## 2020-08-16 NOTE — Progress Notes (Signed)
Urological Symptom Review  Patient is experiencing the following symptoms: None   Review of Systems  Gastrointestinal (upper)  : Nausea Vomiting Gastrointestinal (lower) : Diarrhea  Constitutional : Weight loss  Skin: Negative for skin symptoms  Eyes: Blurred and double vision  Ear/Nose/Throat : Sinus problems  Hematologic/Lymphatic: Negative for Hematologic/Lymphatic symptoms  Cardiovascular : Negative for cardiovascular symptoms  Respiratory : Shortness of breath  Endocrine: Negative for endocrine symptoms  Musculoskeletal: Negative for musculoskeletal symptoms  Neurological: Negative for neurological symptoms  Psychologic: Negative for psychiatric symptoms

## 2020-08-17 ENCOUNTER — Ambulatory Visit: Payer: Medicare Other | Admitting: Urology

## 2020-08-17 LAB — MICROSCOPIC EXAMINATION
Bacteria, UA: NONE SEEN
Epithelial Cells (non renal): >10 /hpf — AB (ref 0–10)
Renal Epithel, UA: NONE SEEN /hpf
WBC, UA: NONE SEEN /hpf (ref 0–5)

## 2020-08-17 LAB — URINALYSIS, ROUTINE W REFLEX MICROSCOPIC
Bilirubin, UA: NEGATIVE
Glucose, UA: NEGATIVE
Leukocytes,UA: NEGATIVE
Nitrite, UA: NEGATIVE
Specific Gravity, UA: 1.025 (ref 1.005–1.030)
Urobilinogen, Ur: 1 mg/dL (ref 0.2–1.0)
pH, UA: 6 (ref 5.0–7.5)

## 2020-08-29 DIAGNOSIS — E063 Autoimmune thyroiditis: Secondary | ICD-10-CM | POA: Diagnosis not present

## 2020-08-29 DIAGNOSIS — I10 Essential (primary) hypertension: Secondary | ICD-10-CM | POA: Diagnosis not present

## 2020-08-29 DIAGNOSIS — E782 Mixed hyperlipidemia: Secondary | ICD-10-CM | POA: Diagnosis not present

## 2020-10-26 ENCOUNTER — Other Ambulatory Visit: Payer: Self-pay | Admitting: Cardiology

## 2020-11-22 IMAGING — CT CT CERVICAL SPINE W/O CM
3 of 4 series · 12 of 33 positions shown, 14 images · non-contrast
Comparison: None.

CLINICAL DATA: Pain for several days

EXAM:
CT CERVICAL SPINE WITHOUT CONTRAST
TECHNIQUE: Multidetector CT imaging of the cervical spine was performed without
intravenous contrast. Multiplanar CT image reconstructions were also
generated.

[Series 5: sagittal bone · sagittal · 0.23mm/px · 5 of 65 slices shown, 6 images]
[im 22/65  bone]
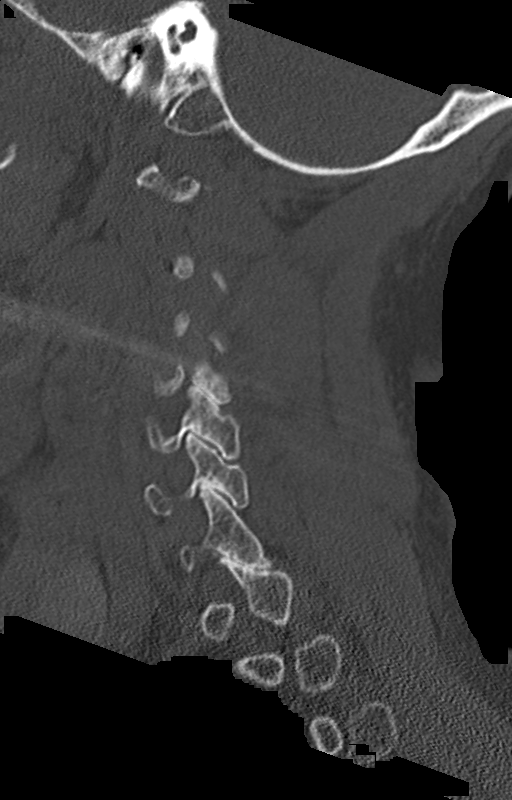
[im 27/65  bone]
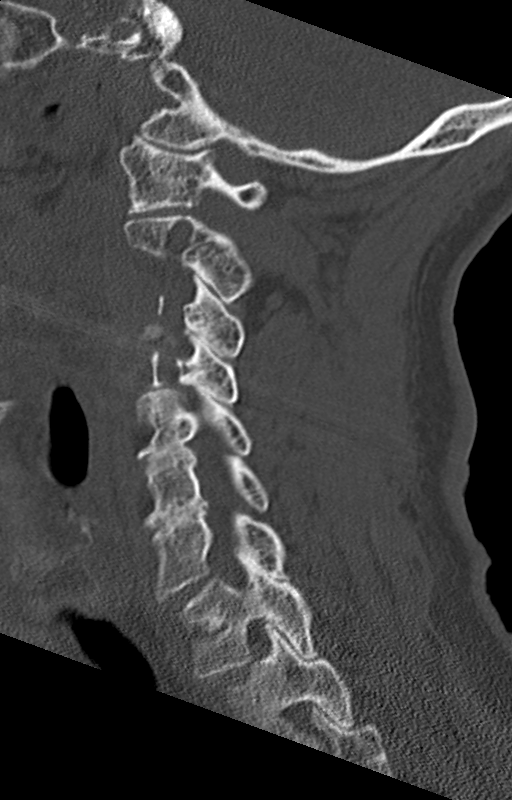
[im 33/65  soft-tissue]
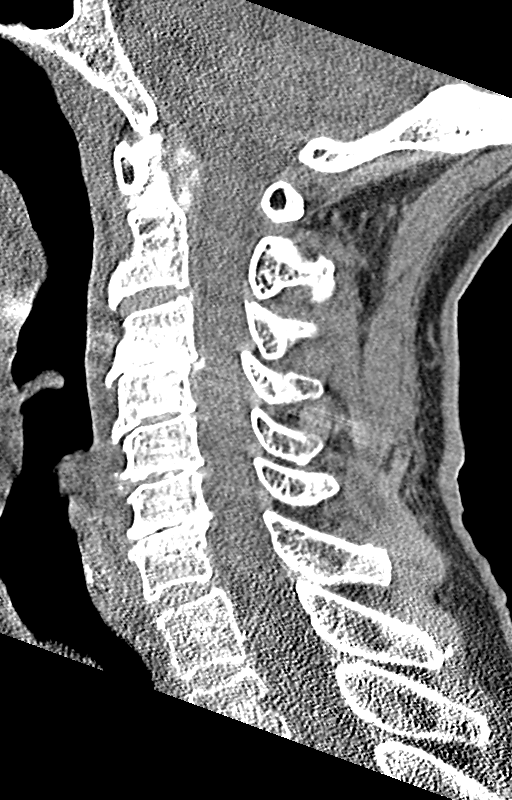
[im 33/65  bone]
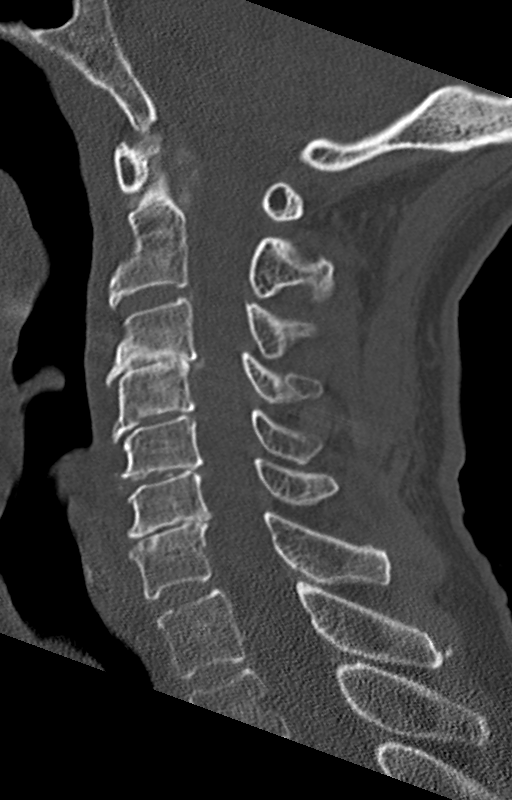
[im 38/65  bone]
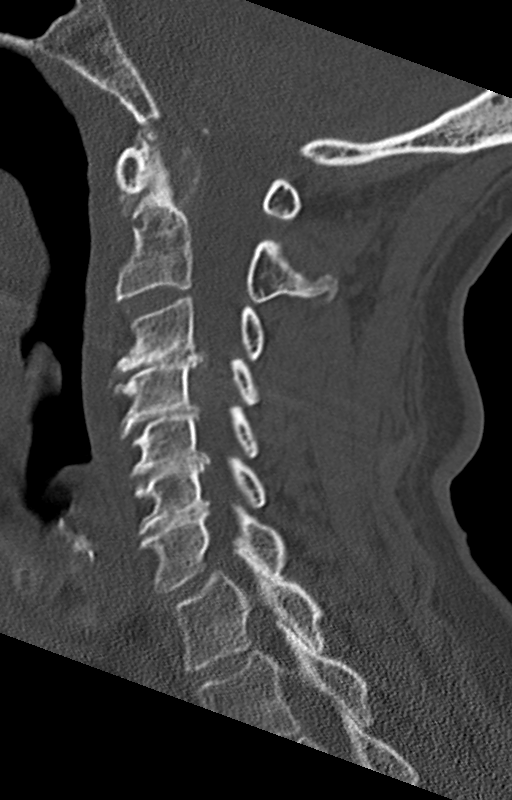
[im 43/65  bone]
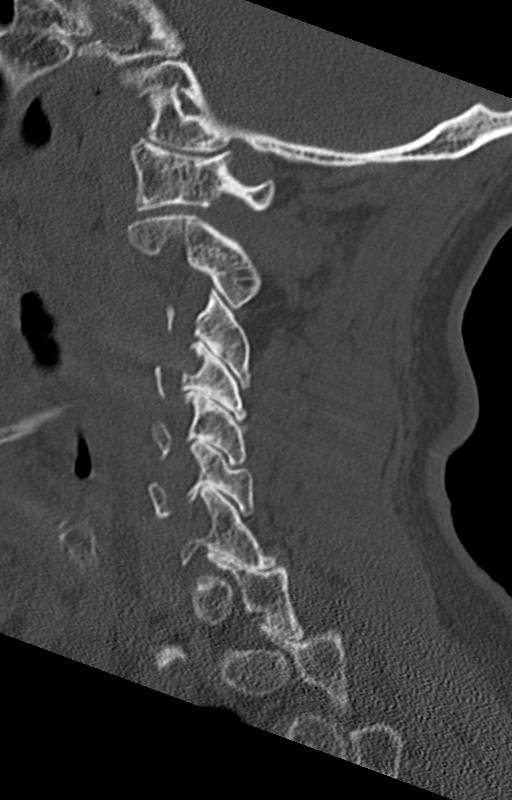

[Series 6: coronal bone · coronal · 0.29mm/px · 3 of 61 slices shown]
[im 13/61  bone]
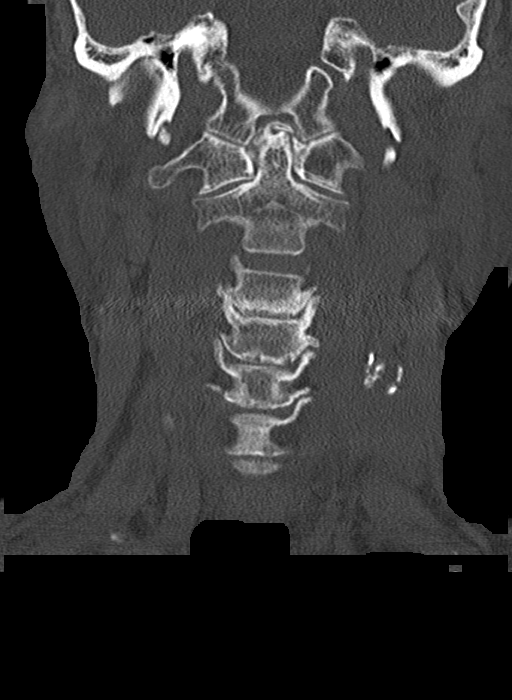
[im 25/61  bone]
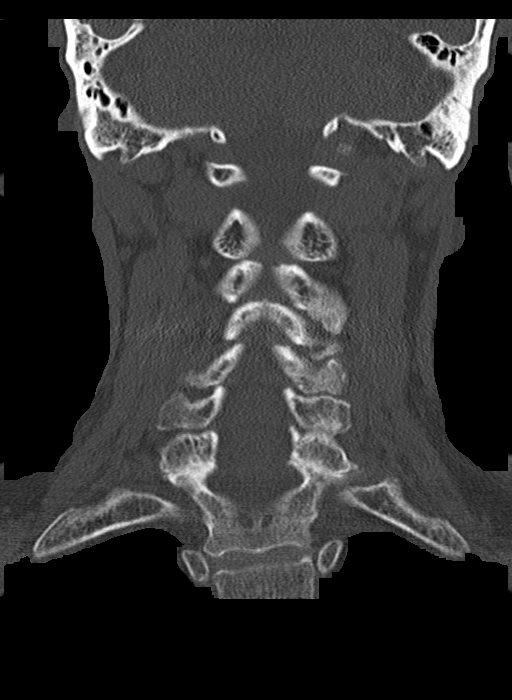
[im 37/61  bone]
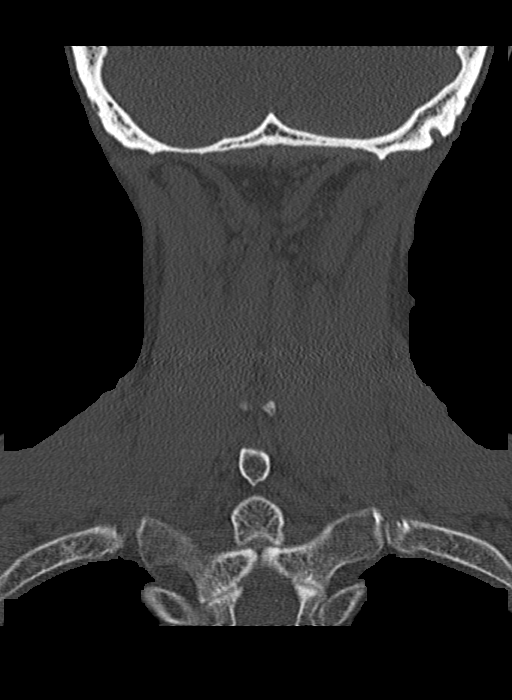

[Series 7: orthogonal bone · axial · 0.21mm/px · z∈[-151,-44]mm · 4 of 80 slices shown, 5 images]
[im 14/80  soft-tissue]
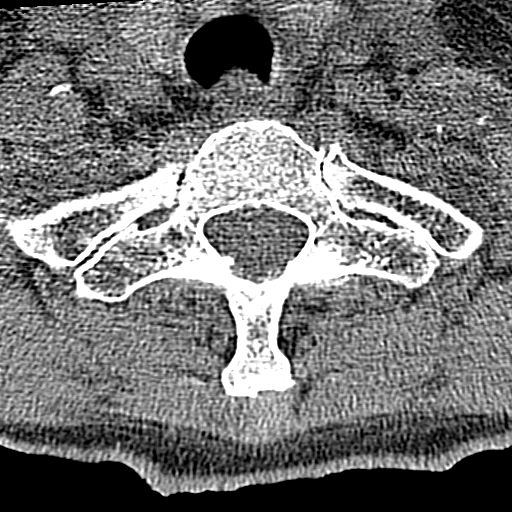
[im 14/80  bone]
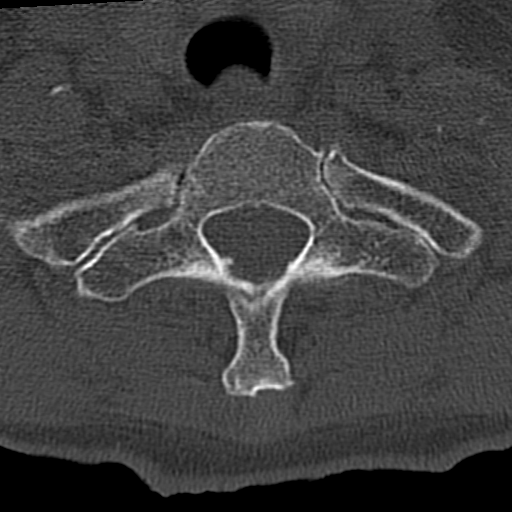
[im 27/80  bone]
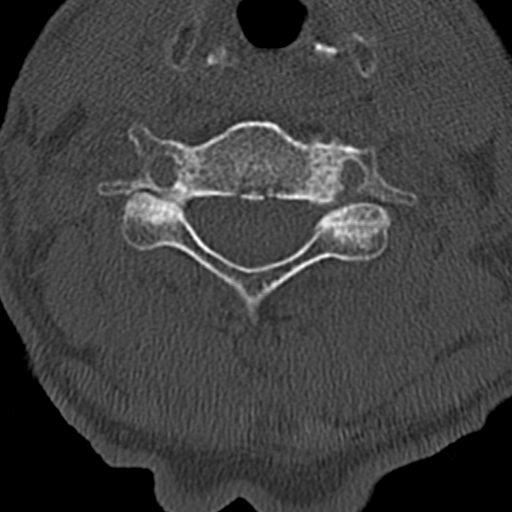
[im 53/80  bone]
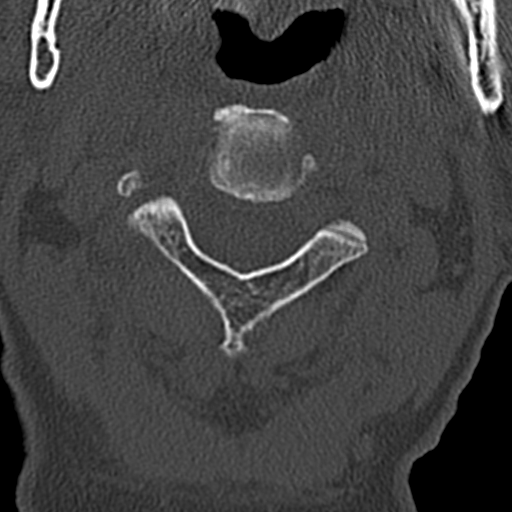
[im 66/80  bone]
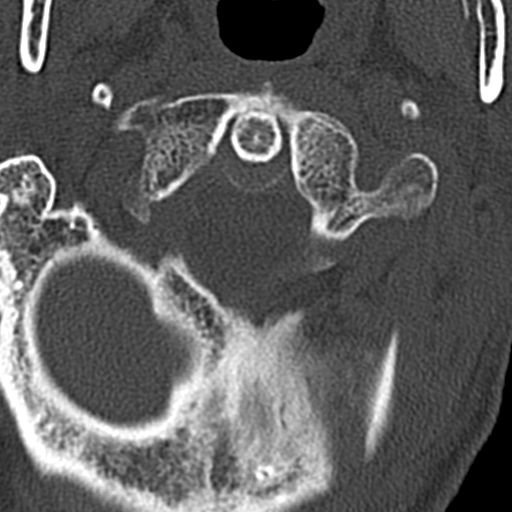

[12 of 33 positions shown; findings below may reference images not displayed]

FINDINGS: Alignment: There is 2 mm of retrolisthesis of C3 on C4. There is 1
mm of anterolisthesis of C7 on T1. No other spondylolisthesis is
evident.

Skull base and vertebrae: The skull base and craniocervical junction
regions appear unremarkable. There is moderate pannus posterior to
the odontoid, not causing significant impression on the
craniocervical junction. There is no demonstrable fracture. There is
no blastic or lytic bone lesion.

Soft tissues and spinal canal: Prevertebral soft tissues and
predental space regions are normal. There is no cord or canal
hematoma evident. No paraspinous lesions are appreciable.

Disc levels: There is severe disc space narrowing at C3-4 and C6-7.
There is moderately severe disc space narrowing at C4-5 and C5-6.

At C2-3, there is mild facet hypertrophy bilaterally. There is no
nerve root edema or effacement. No disc extrusion or stenosis.

At C3-4, there is moderate facet hypertrophy bilaterally. There is
moderate exit foraminal narrowing bilaterally due to bony
hypertrophy. There is impression on the exiting nerve roots on each
side at this level. There is no frank disc extrusion or stenosis.

At C4-5, there is moderate facet hypertrophy bilaterally. There is
mild narrowing of each exit foramen without significant impression
on the respective exiting nerve roots. No disc extrusion or
stenosis.

At C5-6, there is moderate facet hypertrophy bilaterally. There is
moderate impression on the C5-6 exiting nerve root on the left due
to bony hypertrophy. No frank disc extrusion or stenosis.

At C6-7, there is moderate facet hypertrophy bilaterally. There is
mild exit foraminal narrowing bilaterally without appreciable
impression on the exiting nerve roots. No disc extrusion or
stenosis.

At C7-T1, there is moderate facet hypertrophy bilaterally. There is
no evident nerve root edema or effacement. No disc extrusion or
stenosis.

Upper chest: Visualized upper lung zones are clear.

Other: There is calcification in the left subclavian and carotid
arteries bilaterally.
IMPRESSION: No fracture. Areas of slight spondylolisthesis are felt to be due to
underlying spondylosis.

Multilevel arthropathy with impression on exiting nerve roots, most
marked at C3-4 bilaterally. No frank disc extrusion or stenosis.

Foci of subclavian and carotid artery calcification bilaterally
noted.

## 2020-11-29 DIAGNOSIS — E782 Mixed hyperlipidemia: Secondary | ICD-10-CM | POA: Diagnosis not present

## 2020-11-29 DIAGNOSIS — I10 Essential (primary) hypertension: Secondary | ICD-10-CM | POA: Diagnosis not present

## 2020-11-29 DIAGNOSIS — E063 Autoimmune thyroiditis: Secondary | ICD-10-CM | POA: Diagnosis not present

## 2021-01-14 ENCOUNTER — Other Ambulatory Visit: Payer: Self-pay

## 2021-01-14 ENCOUNTER — Ambulatory Visit (HOSPITAL_COMMUNITY)
Admission: RE | Admit: 2021-01-14 | Discharge: 2021-01-14 | Disposition: A | Discharge status: Home / Self Care | Payer: Medicare Other | Source: Ambulatory Visit | Attending: Urology | Admitting: Urology

## 2021-01-14 DIAGNOSIS — N210 Calculus in bladder: Secondary | ICD-10-CM | POA: Insufficient documentation

## 2021-01-14 DIAGNOSIS — I878 Other specified disorders of veins: Secondary | ICD-10-CM | POA: Diagnosis not present

## 2021-01-14 DIAGNOSIS — M419 Scoliosis, unspecified: Secondary | ICD-10-CM | POA: Diagnosis not present

## 2021-01-22 DIAGNOSIS — Z6820 Body mass index (BMI) 20.0-20.9, adult: Secondary | ICD-10-CM | POA: Diagnosis not present

## 2021-01-22 DIAGNOSIS — E063 Autoimmune thyroiditis: Secondary | ICD-10-CM | POA: Diagnosis not present

## 2021-01-22 DIAGNOSIS — I10 Essential (primary) hypertension: Secondary | ICD-10-CM | POA: Diagnosis not present

## 2021-01-22 DIAGNOSIS — Z0000 Encounter for general adult medical examination without abnormal findings: Secondary | ICD-10-CM | POA: Diagnosis not present

## 2021-01-22 DIAGNOSIS — Z1389 Encounter for screening for other disorder: Secondary | ICD-10-CM | POA: Diagnosis not present

## 2021-01-30 DIAGNOSIS — H524 Presbyopia: Secondary | ICD-10-CM | POA: Diagnosis not present

## 2021-01-30 DIAGNOSIS — H40013 Open angle with borderline findings, low risk, bilateral: Secondary | ICD-10-CM | POA: Diagnosis not present

## 2021-02-14 ENCOUNTER — Ambulatory Visit (INDEPENDENT_AMBULATORY_CARE_PROVIDER_SITE_OTHER): Payer: Medicare Other | Admitting: Urology

## 2021-02-14 ENCOUNTER — Other Ambulatory Visit: Payer: Self-pay

## 2021-02-14 VITALS — BP 155/72 | HR 61

## 2021-02-14 DIAGNOSIS — N138 Other obstructive and reflux uropathy: Secondary | ICD-10-CM | POA: Diagnosis not present

## 2021-02-14 DIAGNOSIS — R351 Nocturia: Secondary | ICD-10-CM | POA: Diagnosis not present

## 2021-02-14 DIAGNOSIS — R3129 Other microscopic hematuria: Secondary | ICD-10-CM

## 2021-02-14 DIAGNOSIS — N401 Enlarged prostate with lower urinary tract symptoms: Secondary | ICD-10-CM

## 2021-02-14 DIAGNOSIS — N5201 Erectile dysfunction due to arterial insufficiency: Secondary | ICD-10-CM

## 2021-02-14 DIAGNOSIS — N210 Calculus in bladder: Secondary | ICD-10-CM | POA: Diagnosis not present

## 2021-02-14 LAB — URINALYSIS, ROUTINE W REFLEX MICROSCOPIC
Bilirubin, UA: NEGATIVE
Glucose, UA: NEGATIVE
Ketones, UA: NEGATIVE
Leukocytes,UA: NEGATIVE
Nitrite, UA: NEGATIVE
Protein,UA: NEGATIVE
Specific Gravity, UA: 1.01 (ref 1.005–1.030)
Urobilinogen, Ur: 0.2 mg/dL (ref 0.2–1.0)
pH, UA: 5.5 (ref 5.0–7.5)

## 2021-02-14 LAB — MICROSCOPIC EXAMINATION
Epithelial Cells (non renal): NONE SEEN /hpf (ref 0–10)
Renal Epithel, UA: NONE SEEN /hpf
WBC, UA: NONE SEEN /hpf (ref 0–5)

## 2021-02-14 MED ORDER — FINASTERIDE 5 MG PO TABS: 5.0000 mg | ORAL_TABLET | Freq: Every day | ORAL | 3 refills | Status: DC

## 2021-02-14 NOTE — Progress Notes (Signed)
Subjective:  1. BPH with urinary obstruction   2. Nocturia   3. Bladder stone   4. Microhematuria   5. Erectile dysfunction due to arterial insufficiency        [name]: Initially sent by Dr. Sherwood Gambler 9.6.2019 for a history of BPH with BOO with a rising PSA. He has been on tamsulosin for several years. He is voiding with a reduced stream in the morning but then he voids well the rest of the day. He has minimal nocturia but restricts fluids after 4pm. His PSA was 4.7 in 10/16 and was 5.3 in 6/19 and at his advanced age these levels were felt to be acceptable particularly with the size of his prostate.   11/18/19: He was last seen on 11/02/18 by Dr. Retta Diones for microhematuria and increasing LUTS and was found to have a bladder stone that was reported a large but he didn't agree to therapy at that time.  A CT stone study on 11/08/18  demonstrated a few small right renal stones but there was a 5-71mm right UVJ stone and there were two large bladder stones with the largest about 2.2cm.  He has a very large prostate with a prominent intravesical middle lobe.   The hematuria has cleared but he has nocturia 4-5x.  He drinks water, coffee, tea and Mtn Dews.   He has recently cut back on the fluids in the afternoon which helps.   He does well during the day.  He remains on tamsulosin.   He has no flank pain.    02/17/20:  Brian Solomon returns today in f/u.  He is on finasteride and tamsulosin and is content with the results.  He has minimal nocturia and a good stream.  He feels he empties well.  He has no dysuria or hematuria despite 2 large bladder stones and a RUVJ stone.   He has had no flank pain.  He has 2+ blood in the urine today.  He is interested in discussed ED treatment.    08/16/20: Brian Solomon returns today in f/u.  He remainsn on finasteride and tamsulosin and is voiding well with an IPSS of 7.  He has nocturia x 2 and some frequency.   He has 11-30 RBC's today.  He has bladder and right ureteral stones but declined  therapy.  He has ED and has responded to tadalafil and sildenafil.    02/14/21: Brian Solomon returns today for the history noted above.   He is doing well with an IPSS of 13 which is up but he is very satisfied with his symptoms.  He stopped using the PDE5's because of a prolonged erection.  His UA has stable hematuria.  A KUB in 8/22 showed 2 stable bladder stones with the largest 2.5cm. His previous imaging was a CT in 2020 and the stones are without change.   He has no associated signs or symptoms.    ROS:  ROS:  A complete review of systems was performed.  All systems are negative except for pertinent findings as noted.   ROS  No Known Allergies  Outpatient Encounter Medications as of 02/14/2021  Medication Sig Note   Amino Acids (HIGH PROTEIN PO) Take by mouth.    aspirin 81 MG tablet Take 81 mg by mouth daily.    atorvastatin (LIPITOR) 40 MG tablet TAKE 1 TABLET ONCE DAILY.    losartan (COZAAR) 50 MG tablet Take 50 mg by mouth daily.    metoprolol tartrate (LOPRESSOR) 25 MG tablet TAKE (1/2) TABLET  BY MOUTH 2 TIMES A DAY.    Polyvinyl Alcohol-Povidone (CLEAR EYES ALL SEASONS OP) Apply 1 drop to eye daily as needed (red eyes).    Tamsulosin HCl (FLOMAX) 0.4 MG CAPS Take 0.4 mg by mouth daily after supper.    [DISCONTINUED] finasteride (PROSCAR) 5 MG tablet Take 1 tablet (5 mg total) by mouth daily.    finasteride (PROSCAR) 5 MG tablet Take 1 tablet (5 mg total) by mouth daily.    [DISCONTINUED] levothyroxine (SYNTHROID) 25 MCG tablet Take 25 mcg by mouth daily. (Patient not taking: Reported on 02/14/2021)    [DISCONTINUED] sildenafil (REVATIO) 20 MG tablet 1-5 tabs po prn 1 hour prior to anticipated activity.  Start with 2 tabs. (Patient not taking: Reported on 02/14/2021) 02/14/2021: prolonged erection   [DISCONTINUED] tadalafil (CIALIS) 20 MG tablet Take 1 tablet (20 mg total) by mouth daily as needed for erectile dysfunction. Try tadalafil 20mg  po qday prn. If that by itself doesn't work you  could take 1/2 of the tadalafil 20mg  and 3 of the 20mg  sildenafil in combination to see if that would help. (Patient not taking: Reported on 02/14/2021) 02/14/2021: prolonged erection   [DISCONTINUED] tadalafil (CIALIS) 5 MG tablet Take 2 to 4 tablets 1 hr prior to activity (Patient not taking: Reported on 02/14/2021)    No facility-administered encounter medications on file as of 02/14/2021.    Past Medical History:  Diagnosis Date   CAD (coronary artery disease)    Acute IMI in 2004 with subsequent CABG; cath 2009 60% left main; atretic LIMA; occluded SVG to M1; patent grafts to RCA and D1   Essential hypertension    Hyperlipidemia     Past Surgical History:  Procedure Laterality Date   CARDIAC CATHETERIZATION     CATARACT EXTRACTION W/PHACO Right 12/11/2015   Procedure: CATARACT EXTRACTION PHACO AND INTRAOCULAR LENS PLACEMENT (IOC);  Surgeon: 2005, MD;  Location: [facility];  Service: Ophthalmology;  Laterality: Right;  CDE: 9.44   CORONARY ARTERY BYPASS GRAFT  2004   HERNIA REPAIR      Social History   Socioeconomic History   Marital status: Widowed    Spouse name: Not on file   Number of children: 5   Years of education: Not on file   Highest education level: Not on file  Occupational History   Occupation: retired    Comment: [employer]  Tobacco Use   Smoking status: Former    Packs/day: 0.50    Years: 4.00    Pack years: 2.00    Types: Cigarettes    Start date: 06/20/1942    Quit date: 12/22/1965    Years since quitting: 55.1   Smokeless tobacco: Never  Vaping Use   Vaping Use: Never used  Substance and Sexual Activity   Alcohol use: No    Alcohol/week: 0.0 standard drinks   Drug use: No   Sexual activity: Not on file  Other Topics Concern   Not on file  Social History Narrative   Not on file   Social Determinants of Health   Financial Resource Strain: Not on file  Food Insecurity: Not on file  Transportation Needs: Not on file  Physical Activity:  Not on file  Stress: Not on file  Social Connections: Not on file  Intimate Partner Violence: Not on file    Family History  Problem Relation Age of Onset   Heart disease Father        and a brother   Stroke Mother  and brother       Objective:   Physical Exam  Lab Results:  Results for orders placed or performed in visit on 02/14/21 (from the past 24 hour(s))  Urinalysis, Routine w reflex microscopic     Status: Abnormal   Collection Time: 02/14/21  1:32 PM  Result Value Ref Range   Specific Gravity, UA 1.010 1.005 - 1.030   pH, UA 5.5 5.0 - 7.5   Color, UA Yellow Yellow   Appearance Ur Clear Clear   Leukocytes,UA Negative Negative   Protein,UA Negative Negative/Trace   Glucose, UA Negative Negative   Ketones, UA Negative Negative   RBC, UA 1+ (A) Negative   Bilirubin, UA Negative Negative   Urobilinogen, Ur 0.2 0.2 - 1.0 mg/dL   Nitrite, UA Negative Negative   Microscopic Examination See below:    Narrative   Performed at:  [address] - [facility] [address] [place], [place]  [id number] Lab Director: Chinita Pester MT, Phone:  [phone]  Microscopic Examination     Status: Abnormal   Collection Time: 02/14/21  1:32 PM   Urine  Result Value Ref Range   WBC, UA None seen 0 - 5 /hpf   RBC 11-30 (A) 0 - 2 /hpf   Epithelial Cells (non renal) None seen 0 - 10 /hpf   Renal Epithel, UA None seen None seen /hpf   Bacteria, UA Few (A) None seen/Few   Narrative   Performed at:  [address] - [facility] [address] [place], [place]  [id number] Lab Director: Chinita Pester MT, Phone:  [phone]    BMET No results for input(s): NA, K, CL, CO2, GLUCOSE, BUN, CREATININE, CALCIUM in the last 72 hours. PSA No results found for: PSA No results found for: TESTOSTERONE    Studies/Results: DG Abd 1 View  Result Date: 01/14/2021 CLINICAL DATA:  Bladder stones EXAM: ABDOMEN - 1 VIEW COMPARISON:  CT 11/08/2018 FINDINGS: Marked dextroscoliosis.  Nonobstructed gas pattern with moderate stool. Multiple oval calcifications in the pelvis presumably bladder stones measuring up to 2.3 cm. Phleboliths in the pelvis. IMPRESSION: Multiple large bladder stones.  Nonobstructed gas pattern. Electronically Signed   By: Jasmine Pang M.D.   On: 01/14/2021 22:25       Assessment & Plan: BPH with BOO.  He is doing well on finasteride and tamsulosin. Finasteride refilled.   Bladder stones and small renal stones and microhematuria.   He had no obstruction and has no flank pain.  KUB shows stable bladder stones.   ED.  He stopped the PDE5's because of prolonged erections.      Meds ordered this encounter  Medications   finasteride (PROSCAR) 5 MG tablet    Sig: Take 1 tablet (5 mg total) by mouth daily.    Dispense:  90 tablet    Refill:  3      Orders Placed This Encounter  Procedures   Microscopic Examination   DG Abd 1 View    Standing Status:   Future    Standing Expiration Date:   02/14/2022    Order Specific Question:   Reason for Exam (SYMPTOM  OR DIAGNOSIS REQUIRED)    Answer:   bladder stones    Order Specific Question:   Preferred imaging location?    Answer:   [facility]    Order Specific Question:   Radiology Contrast Protocol - do NOT remove file path    Answer:   [email or web address]   Urinalysis, Routine w reflex  microscopic      Return in about 1 year (around 02/14/2022) for with KUB.   CC: Elfredia Nevins, MD      Bjorn Pippin 02/14/2021 Patient ID: Brian Solomon, male   DOB: July 11, 1924, 87 y.o.   MRN: [id number]

## 2021-02-14 NOTE — Progress Notes (Signed)
Urological Symptom Review  Patient is experiencing the following symptoms: Erection problems (male only) Kidney stones   Review of Systems  Gastrointestinal (upper)  : Negative for upper GI symptoms  Gastrointestinal (lower) : Negative for lower GI symptoms  Constitutional : Negative for symptoms  Skin: Negative for skin symptoms  Eyes: Negative for eye symptoms  Ear/Nose/Throat : Negative for Ear/Nose/Throat symptoms  Hematologic/Lymphatic: Negative for Hematologic/Lymphatic symptoms  Cardiovascular : Leg swelling  Respiratory : Negative for respiratory symptoms  Endocrine: Negative for endocrine symptoms  Musculoskeletal: Negative for musculoskeletal symptoms  Neurological: Negative for neurological symptoms  Psychologic: Negative for psychiatric symptoms

## 2021-06-04 DIAGNOSIS — Z1389 Encounter for screening for other disorder: Secondary | ICD-10-CM | POA: Diagnosis not present

## 2021-06-04 DIAGNOSIS — E039 Hypothyroidism, unspecified: Secondary | ICD-10-CM | POA: Diagnosis not present

## 2021-06-04 DIAGNOSIS — Z0000 Encounter for general adult medical examination without abnormal findings: Secondary | ICD-10-CM | POA: Diagnosis not present

## 2021-06-04 DIAGNOSIS — E782 Mixed hyperlipidemia: Secondary | ICD-10-CM | POA: Diagnosis not present

## 2021-06-04 DIAGNOSIS — Z6820 Body mass index (BMI) 20.0-20.9, adult: Secondary | ICD-10-CM | POA: Diagnosis not present

## 2021-06-05 ENCOUNTER — Encounter: Payer: Self-pay | Admitting: Emergency Medicine

## 2021-06-05 ENCOUNTER — Other Ambulatory Visit: Payer: Self-pay

## 2021-06-05 ENCOUNTER — Ambulatory Visit
Admission: EM | Admit: 2021-06-05 | Discharge: 2021-06-05 | Disposition: A | Discharge status: Home / Self Care | Payer: Medicare Other | Attending: Family Medicine | Admitting: Family Medicine

## 2021-06-05 DIAGNOSIS — H6123 Impacted cerumen, bilateral: Secondary | ICD-10-CM | POA: Diagnosis not present

## 2021-06-05 MED ORDER — CARBAMIDE PEROXIDE 6.5 % OT SOLN: 5.0000 [drp] | Freq: Two times a day (BID) | OTIC | 0 refills | Status: AC

## 2021-06-05 NOTE — ED Triage Notes (Signed)
Bilateral ears feel stopped up x 3 to 4 weeks.

## 2021-06-05 NOTE — ED Provider Notes (Signed)
[facility]    CSN: [id number] Arrival date & time: 06/05/21  8250      History   Chief Complaint No chief complaint on file.   HPI Brian Solomon is a 88 y.o. male.   Presenting today with 3 to 4-week history of ear fullness, muffled hearing.  States he has had to get his ears flushed out in the past and thinks he may need that again.  Has been trying to wash them with warm water at home with no relief.  Denies pain, drainage, headache, congestion.    Past Medical History:  Diagnosis Date   CAD (coronary artery disease)    Acute IMI in 2004 with subsequent CABG; cath 2009 60% left main; atretic LIMA; occluded SVG to M1; patent grafts to RCA and D1   Essential hypertension    Hyperlipidemia     Patient Active Problem List   Diagnosis Date Noted   Bladder stone 11/18/2019   Arteriosclerotic cardiovascular disease (ASCVD)    Hyperlipidemia    Tobacco abuse, in remission    Hypertension     Past Surgical History:  Procedure Laterality Date   CARDIAC CATHETERIZATION     CATARACT EXTRACTION W/PHACO Right 12/11/2015   Procedure: CATARACT EXTRACTION PHACO AND INTRAOCULAR LENS PLACEMENT (IOC);  Surgeon: Jethro Bolus, MD;  Location: [facility];  Service: Ophthalmology;  Laterality: Right;  CDE: 9.44   CORONARY ARTERY BYPASS GRAFT  2004   HERNIA REPAIR         Home Medications    Prior to Admission medications   Medication Sig Start Date End Date Taking? Authorizing Provider  carbamide peroxide (DEBROX) 6.5 % OTIC solution Place 5 drops into both ears 2 (two) times daily. 06/05/21  Yes Particia Nearing, PA-C  Amino Acids (HIGH PROTEIN PO) Take by mouth.    [provider]  aspirin 81 MG tablet Take 81 mg by mouth daily.    [provider]  atorvastatin (LIPITOR) 40 MG tablet TAKE 1 TABLET ONCE DAILY. 10/26/20   Jonelle Sidle, MD  finasteride (PROSCAR) 5 MG tablet Take 1 tablet (5 mg total) by mouth daily. 02/14/21   Bjorn Pippin, MD   losartan (COZAAR) 50 MG tablet Take 50 mg by mouth daily.    [provider]  metoprolol tartrate (LOPRESSOR) 25 MG tablet TAKE (1/2) TABLET BY MOUTH 2 TIMES A DAY. 06/28/20   Jonelle Sidle, MD  Polyvinyl Alcohol-Povidone (CLEAR EYES ALL SEASONS OP) Apply 1 drop to eye daily as needed (red eyes).    [provider]  Tamsulosin HCl (FLOMAX) 0.4 MG CAPS Take 0.4 mg by mouth daily after supper.    [provider]    Family History Family History  Problem Relation Age of Onset   Heart disease Father        and a brother   Stroke Mother        and brother    Social History Social History   Tobacco Use   Smoking status: Former    Packs/day: 0.50    Years: 4.00    Pack years: 2.00    Types: Cigarettes    Start date: 06/20/1942    Quit date: 12/22/1965    Years since quitting: 55.4   Smokeless tobacco: Never  Vaping Use   Vaping Use: Never used  Substance Use Topics   Alcohol use: No    Alcohol/week: 0.0 standard drinks   Drug use: No     Allergies  Patient has no known allergies.   Review of Systems Review of Systems Per HPI  Physical Exam Triage Vital Signs ED Triage Vitals  Enc Vitals Group     BP 06/05/21 1043 (!) 167/88     Pulse Rate 06/05/21 1043 60     Resp 06/05/21 1043 18     Temp 06/05/21 1043 97.6 F (36.4 C)     Temp Source 06/05/21 1043 Oral     SpO2 06/05/21 1043 98 %     Weight --      Height --      Head Circumference --      Peak Flow --      Pain Score 06/05/21 1045 0     Pain Loc --      Pain Edu? --      Excl. in GC? --    No data found.  Updated Vital Signs BP (!) 167/88 (BP Location: Right Arm)    Pulse 60    Temp 97.6 F (36.4 C) (Oral)    Resp 18    SpO2 98%   Visual Acuity Right Eye Distance:   Left Eye Distance:   Bilateral Distance:    Right Eye Near:   Left Eye Near:    Bilateral Near:     Physical Exam Vitals and nursing note reviewed.  Constitutional:      Appearance: Normal  appearance.  HENT:     Head: Atraumatic.     Right Ear: There is impacted cerumen.     Left Ear: There is impacted cerumen.     Nose: Nose normal.     Mouth/Throat:     Mouth: Mucous membranes are moist.     Pharynx: Oropharynx is clear.  Eyes:     Extraocular Movements: Extraocular movements intact.     Conjunctiva/sclera: Conjunctivae normal.  Cardiovascular:     Rate and Rhythm: Normal rate and regular rhythm.  Pulmonary:     Effort: Pulmonary effort is normal.     Breath sounds: Normal breath sounds.  Musculoskeletal:        General: Normal range of motion.     Cervical back: Normal range of motion and neck supple.  Skin:    General: Skin is warm and dry.  Neurological:     General: No focal deficit present.     Mental Status: He is oriented to person, place, and time.  Psychiatric:        Mood and Affect: Mood normal.        Thought Content: Thought content normal.        Judgment: Judgment normal.     UC Treatments / Results  Labs (all labs ordered are listed, but only abnormal results are displayed) Labs Reviewed - No data to display  EKG   Radiology No results found.  Procedures Procedures (including critical care time)  Medications Ordered in UC Medications - No data to display  Initial Impression / Assessment and Plan / UC Course  I have reviewed the triage vital signs and the nursing notes.  Pertinent labs & imaging results that were available during my care of the patient were reviewed by me and considered in my medical decision making (see chart for details).     Mildly hypertensive in triage, otherwise vital signs benign and reassuring.  Numerous efforts to perform lavage with warm water and peroxide, utilize tool to remove wax were performed with only partial clearance bilaterally.  Unable to visualize TMs bilaterally due to  solid wax impaction.  We will send Debrox drops and discussed continued home lavage, return if unable to fully clear.  45  minutes spent today in direct patient care, education and procedure.  Final Clinical Impressions(s) / UC Diagnoses   Final diagnoses:  Bilateral impacted cerumen   Discharge Instructions   None    ED Prescriptions     Medication Sig Dispense Auth. Provider   carbamide peroxide (DEBROX) 6.5 % OTIC solution Place 5 drops into both ears 2 (two) times daily. 15 mL Particia NearingLane, Andrian Urbach [place], [place]-C      PDMP not reviewed this encounter.   Particia NearingLane, Rahim Astorga [place], [place] 06/05/21 1153

## 2021-06-10 DIAGNOSIS — R300 Dysuria: Secondary | ICD-10-CM | POA: Diagnosis not present

## 2021-06-10 DIAGNOSIS — E063 Autoimmune thyroiditis: Secondary | ICD-10-CM | POA: Diagnosis not present

## 2021-06-10 DIAGNOSIS — I10 Essential (primary) hypertension: Secondary | ICD-10-CM | POA: Diagnosis not present

## 2021-06-29 ENCOUNTER — Other Ambulatory Visit: Payer: Self-pay | Admitting: Cardiology

## 2021-07-30 ENCOUNTER — Other Ambulatory Visit: Payer: Self-pay | Admitting: Cardiology

## 2021-09-11 ENCOUNTER — Other Ambulatory Visit: Payer: Self-pay | Admitting: Cardiology

## 2021-09-18 NOTE — Progress Notes (Signed)
? ?Cardiology Office Note   ? ?Date:  09/21/2021  ? ?ID:  Brian Solomon, DOB 1925-05-21, MRN [id number] ? ?PCP:  Brian Solomon, [place], MD  ?Cardiologist: Brian DellSamuel McDowell, MD   ? ?Chief Complaint  ?Patient presents with  ? Follow-up  ?  Overdue Visit  ? ? ?History of Present Illness:   ? ?Brian Solomon is a 88 y.o. male with past medical history of CAD (s/p CABG in 2004 with cath in 2009 showing atretic LIMA and occluded SVG-OM1 with patent grafts to RCA and D1), HTN and HLD who presents to the office today for overdue follow-up. ? ?He was last examined by Dr. [name] in 02/2020 and reported staying active at baseline and denied any recent anginal symptoms. He was continued on his current cardiac medications including ASA, Lipitor, Losartan and Lopressor. ? ?In talking with the patient today, he reports overall doing well since his last office visit. His wife passed away last year and he has been lonely since. He has 3 living children that he talks to regularly (3 have passed away). He remains active around his home and performs ADL's independently. Still drives and does his own shopping. No recent chest pain or dyspnea on exertion with routine activities. No recent palpitations, orthopnea or PND. He does experience intermittent lower extremity edema but consumes a high-sodium diet as he enjoys pork and adds salt to his meals.  ? ? ?Past Medical History:  ?Diagnosis Date  ? CAD (coronary artery disease)   ? Acute MI in 2004 with subsequent CABG; cath 2009 60% left main; atretic LIMA; occluded SVG to M1; patent grafts to RCA and D1  ? Essential hypertension   ? Hyperlipidemia   ? ? ?Past Surgical History:  ?Procedure Laterality Date  ? CARDIAC CATHETERIZATION    ? CATARACT EXTRACTION W/PHACO Right 12/11/2015  ? Procedure: CATARACT EXTRACTION PHACO AND INTRAOCULAR LENS PLACEMENT ([facility]);  Surgeon: Brian BolusMark Shapiro, MD;  Location: [facility];  Service: Ophthalmology;  Laterality: Right;  CDE: 9.44  ? CORONARY ARTERY BYPASS GRAFT   2004  ? HERNIA REPAIR    ? ? ?Current Medications: ?Outpatient Medications Prior to Visit  ?Medication Sig Dispense Refill  ? Amino Acids (HIGH PROTEIN PO) Take by mouth.    ? aspirin 81 MG tablet Take 81 mg by mouth daily.    ? atorvastatin (LIPITOR) 40 MG tablet TAKE 1 TABLET ONCE DAILY. 30 tablet 6  ? carbamide peroxide (DEBROX) 6.5 % OTIC solution Place 5 drops into both ears 2 (two) times daily. 15 mL 0  ? finasteride (PROSCAR) 5 MG tablet Take 1 tablet (5 mg total) by mouth daily. 90 tablet 3  ? losartan (COZAAR) 50 MG tablet Take 50 mg by mouth daily.    ? Polyvinyl Alcohol-Povidone (CLEAR EYES ALL SEASONS OP) Apply 1 drop to eye daily as needed (red eyes).    ? Tamsulosin HCl (FLOMAX) 0.4 MG CAPS Take 0.4 mg by mouth daily after supper.    ? metoprolol tartrate (LOPRESSOR) 25 MG tablet TAKE (1/2) TABLET BY MOUTH 2 TIMES A DAY. 15 tablet 0  ? ?No facility-administered medications prior to visit.  ?  ? ?Allergies:   Patient has no known allergies.  ? ?Social History  ? ?Socioeconomic History  ? Marital status: Widowed  ?  Spouse name: Not on file  ? Number of children: 5  ? Years of education: Not on file  ? Highest education level: Not on file  ?Occupational History  ? Occupation: retired  ?  Comment: [employer]  ?Tobacco Use  ? Smoking status: Former  ?  Packs/day: 0.50  ?  Years: 4.00  ?  Pack years: 2.00  ?  Types: Cigarettes  ?  Start date: 06/20/1942  ?  Quit date: 12/22/1965  ?  Years since quitting: 55.7  ? Smokeless tobacco: Never  ?Vaping Use  ? Vaping Use: Never used  ?Substance and Sexual Activity  ? Alcohol use: No  ?  Alcohol/week: 0.0 standard drinks  ? Drug use: No  ? Sexual activity: Not on file  ?Other Topics Concern  ? Not on file  ?Social History Narrative  ? Not on file  ? ?Social Determinants of Health  ? ?Financial Resource Strain: Not on file  ?Food Insecurity: Not on file  ?Transportation Needs: Not on file  ?Physical Activity: Not on file  ?Stress: Not on file  ?Social  Connections: Not on file  ?  ? ?Family History:  The patient's family history includes Heart disease in his father; Stroke in his mother.  ? ?Review of Systems:   ? ?Please see the history of present illness.    ? ?All other systems reviewed and are otherwise negative except as noted above. ? ? ?Physical Exam:   ? ?VS:  BP 120/64   Pulse 68   Ht 5\' 4"  (1.626 m)   Wt 120 lb 6.4 oz (54.6 kg)   SpO2 95%   BMI 20.67 kg/m?    ?General: Pleasant elderly male appearing in no acute distress. Appears younger than his actual age.  ?Head: Normocephalic, atraumatic. ?Neck: No carotid bruits. JVD not elevated.  ?Lungs: Respirations regular and unlabored, without wheezes or rales.  ?Heart: Regular rate and rhythm. No S3 or S4.  No murmur, no rubs, or gallops appreciated. ?Abdomen: Appears non-distended. No obvious abdominal masses. ?Msk:  Strength and tone appear normal for age. No obvious joint deformities or effusions. ?Extremities: No clubbing or cyanosis. Trace lower extremity edema.  Distal pedal pulses are 2+ bilaterally. ?Neuro: Alert and oriented X 3. Moves all extremities spontaneously. No focal deficits noted. ?Psych:  Responds to questions appropriately with a normal affect. ?Skin: No rashes or lesions noted ? ?Wt Readings from Last 3 Encounters:  ?09/20/21 120 lb 6.4 oz (54.6 kg)  ?08/16/20 120 lb (54.4 kg)  ?02/17/20 127 lb (57.6 kg)  ?  ? ?Studies/Labs Reviewed:  ? ?EKG:  EKG is ordered today. The ekg ordered today demonstrates NSR, HR 65 with variable rate and inferior infarct pattern which is similar to prior tracings.  ? ?Recent Labs: ?No results found for requested labs within last 8760 hours.  ? ?Lipid Panel ?   ?Component Value Date/Time  ? CHOL 174 03/04/2012 0821  ? TRIG 58 03/04/2012 0821  ? HDL 71 03/04/2012 0821  ? CHOLHDL 2.5 03/04/2012 0821  ? VLDL 12 03/04/2012 0821  ? LDLCALC 91 03/04/2012 0821  ? ? ?Additional studies/ records that were reviewed today include:  ? ?Echocardiogram:  2011 ? ? ?Assessment:   ? ?1. Coronary artery disease involving native coronary artery of native heart without angina pectoris   ?2. Essential hypertension   ?3. Hyperlipidemia LDL goal <70   ? ? ? ?Plan:  ? ?In order of problems listed above: ? ?1. CAD ?- He is s/p CABG in 2004 with cath in 2009 showing atretic LIMA and occluded SVG-OM1 with patent grafts to RCA and D1. He is very clear in his decision that he would not want to undergo additional procedures  or surgeries given his advanced age which is certainly reasonable, therefore would not plan for further ischemic evaluation. Thankfully, he continues to do remarkably well and denies any anginal symptoms.  ?- Continue current medical therapy with ASA 81mg  daily, Atorvastatin 40mg  daily and Lopressor 12.5mg  BID.  ? ?2. HTN ?- His BP is well-controlled at 120/64 during today's visit. Continue current medication regimen with Losartan 50mg  daily and Lopressor 12.5mg  BID.  ? ?3. HLD ?- Will request a copy of most recent labs from his PCP. He remains on Atorvastatin 40mg  daily.  ? ? ?Medication Adjustments/Labs and Tests Ordered: ?Current medicines are reviewed at length with the patient today.  Concerns regarding medicines are outlined above.  Medication changes, Labs and Tests ordered today are listed in the Patient Instructions below. ?Patient Instructions  ?Medication Instructions:  ?Your physician recommends that you continue on your current medications as directed. Please refer to the Current Medication list given to you today. ? ? ?Labwork: ?None today ? ?Testing/Procedures: ?None today ? ?Follow-Up: ?1 year ? ?Any Other Special Instructions Will Be Listed Below (If Applicable). ? ?If you need a refill on your cardiac medications before your next appointment, please call your pharmacy. ?  ? ?Signed, ? , PA-C  ?09/21/2021 9:29 AM    ?[facility] ?[address] [place], [place] 09/23/2021 ?Phone: [phone] ?Fax: [phone] ? ? ?

## 2021-09-20 ENCOUNTER — Ambulatory Visit (INDEPENDENT_AMBULATORY_CARE_PROVIDER_SITE_OTHER): Payer: Medicare Other | Admitting: Student

## 2021-09-20 ENCOUNTER — Encounter: Payer: Self-pay | Admitting: Student

## 2021-09-20 VITALS — BP 120/64 | HR 68 | Ht 64.0 in | Wt 120.4 lb

## 2021-09-20 DIAGNOSIS — I10 Essential (primary) hypertension: Secondary | ICD-10-CM

## 2021-09-20 DIAGNOSIS — E785 Hyperlipidemia, unspecified: Secondary | ICD-10-CM | POA: Diagnosis not present

## 2021-09-20 DIAGNOSIS — I2510 Atherosclerotic heart disease of native coronary artery without angina pectoris: Secondary | ICD-10-CM | POA: Diagnosis not present

## 2021-09-20 DIAGNOSIS — E782 Mixed hyperlipidemia: Secondary | ICD-10-CM

## 2021-09-20 MED ORDER — METOPROLOL TARTRATE 25 MG PO TABS: 12.5000 mg | ORAL_TABLET | Freq: Two times a day (BID) | ORAL | 3 refills | Status: DC

## 2021-09-20 NOTE — Patient Instructions (Signed)
Medication Instructions:  Your physician recommends that you continue on your current medications as directed. Please refer to the Current Medication list given to you today.   Labwork: None today  Testing/Procedures: None today  Follow-Up: 1 year  Any Other Special Instructions Will Be Listed Below (If Applicable).  If you need a refill on your cardiac medications before your next appointment, please call your pharmacy.  

## 2021-09-21 ENCOUNTER — Encounter: Payer: Self-pay | Admitting: Student

## 2021-10-29 ENCOUNTER — Other Ambulatory Visit: Payer: Self-pay | Admitting: Cardiology

## 2022-02-14 ENCOUNTER — Ambulatory Visit (HOSPITAL_COMMUNITY)
Admission: RE | Admit: 2022-02-14 | Discharge: 2022-02-14 | Disposition: A | Discharge status: Home / Self Care | Payer: Medicare Other | Source: Ambulatory Visit | Attending: Urology | Admitting: Urology

## 2022-02-14 DIAGNOSIS — I878 Other specified disorders of veins: Secondary | ICD-10-CM | POA: Diagnosis not present

## 2022-02-14 DIAGNOSIS — N210 Calculus in bladder: Secondary | ICD-10-CM | POA: Insufficient documentation

## 2022-02-14 DIAGNOSIS — M47816 Spondylosis without myelopathy or radiculopathy, lumbar region: Secondary | ICD-10-CM | POA: Diagnosis not present

## 2022-02-20 ENCOUNTER — Ambulatory Visit (INDEPENDENT_AMBULATORY_CARE_PROVIDER_SITE_OTHER): Payer: Medicare Other | Admitting: Urology

## 2022-02-20 VITALS — BP 152/83 | HR 57

## 2022-02-20 DIAGNOSIS — N401 Enlarged prostate with lower urinary tract symptoms: Secondary | ICD-10-CM

## 2022-02-20 DIAGNOSIS — N210 Calculus in bladder: Secondary | ICD-10-CM

## 2022-02-20 DIAGNOSIS — R351 Nocturia: Secondary | ICD-10-CM

## 2022-02-20 DIAGNOSIS — N138 Other obstructive and reflux uropathy: Secondary | ICD-10-CM

## 2022-02-20 DIAGNOSIS — N200 Calculus of kidney: Secondary | ICD-10-CM

## 2022-02-20 DIAGNOSIS — R3129 Other microscopic hematuria: Secondary | ICD-10-CM

## 2022-02-20 MED ORDER — FINASTERIDE 5 MG PO TABS: 5.0000 mg | ORAL_TABLET | Freq: Every day | ORAL | 3 refills | Status: DC

## 2022-02-20 NOTE — Progress Notes (Signed)
Subjective:  1. BPH with urinary obstruction   2. Nocturia   3. Bladder stone   4. Microhematuria        [name]: Initially sent by Dr. Sherwood Gambler 9.6.2019 for a history of BPH with BOO with a rising PSA. He has been on tamsulosin for several years. He is voiding with a reduced stream in the morning but then he voids well the rest of the day. He has minimal nocturia but restricts fluids after 4pm. His PSA was 4.7 in 10/16 and was 5.3 in 6/19 and at his advanced age these levels were felt to be acceptable particularly with the size of his prostate.   11/18/19: He was last seen on 11/02/18 by Dr. Retta Diones for microhematuria and increasing LUTS and was found to have a bladder stone that was reported a large but he didn't agree to therapy at that time.  A CT stone study on 11/08/18  demonstrated a few small right renal stones but there was a 5-45mm right UVJ stone and there were two large bladder stones with the largest about 2.2cm.  He has a very large prostate with a prominent intravesical middle lobe.   The hematuria has cleared but he has nocturia 4-5x.  He drinks water, coffee, tea and Mtn Dews.   He has recently cut back on the fluids in the afternoon which helps.   He does well during the day.  He remains on tamsulosin.   He has no flank pain.    02/17/20:  Barry Dienes returns today in f/u.  He is on finasteride and tamsulosin and is content with the results.  He has minimal nocturia and a good stream.  He feels he empties well.  He has no dysuria or hematuria despite 2 large bladder stones and a RUVJ stone.   He has had no flank pain.  He has 2+ blood in the urine today.  He is interested in discussed ED treatment.    08/16/20: Christpoher returns today in f/u.  He remainsn on finasteride and tamsulosin and is voiding well with an IPSS of 7.  He has nocturia x 2 and some frequency.   He has 11-30 RBC's today.  He has bladder and right ureteral stones but declined therapy.  He has ED and has responded to tadalafil and  sildenafil.    02/14/21: Seraj returns today for the history noted above.   He is doing well with an IPSS of 13 which is up but he is very satisfied with his symptoms.  He stopped using the PDE5's because of a prolonged erection.  His UA has stable hematuria.  A KUB in 8/22 showed 2 stable bladder stones with the largest 2.5cm. His previous imaging was a CT in 2020 and the stones are without change.   He has no associated signs or symptoms.    02/20/22: Duff returns today in f/u for his history of BPH with BOO and bladder stones.  The stones are slightly larger on KUB prior to this visit.  He remains on finasteride and tamsulosin.   Last week had a period of frequency and urgency every 4-5 minutes for about 8 hours and then it stopped.  He passed a couple of small clots at the end of that period.   ROS:  ROS:  A complete review of systems was performed.  All systems are negative except for pertinent findings as noted.   Review of Systems  HENT:  Positive for congestion.   Eyes:  Positive  for blurred vision.  Cardiovascular:  Positive for leg swelling.  Neurological:  Positive for dizziness and weakness.  Psychiatric/Behavioral:  Positive for memory loss.     No Known Allergies  Outpatient Encounter Medications as of 02/20/2022  Medication Sig   atorvastatin (LIPITOR) 40 MG tablet TAKE 1 TABLET ONCE DAILY.   Amino Acids (HIGH PROTEIN PO) Take by mouth.   aspirin 81 MG tablet Take 81 mg by mouth daily.   carbamide peroxide (DEBROX) 6.5 % OTIC solution Place 5 drops into both ears 2 (two) times daily.   finasteride (PROSCAR) 5 MG tablet Take 1 tablet (5 mg total) by mouth daily.   losartan (COZAAR) 50 MG tablet Take 50 mg by mouth daily.   metoprolol tartrate (LOPRESSOR) 25 MG tablet Take 0.5 tablets (12.5 mg total) by mouth 2 (two) times daily.   Polyvinyl Alcohol-Povidone (CLEAR EYES ALL SEASONS OP) Apply 1 drop to eye daily as needed (red eyes).   Tamsulosin HCl (FLOMAX) 0.4 MG CAPS  Take 0.4 mg by mouth daily after supper.   [DISCONTINUED] finasteride (PROSCAR) 5 MG tablet Take 1 tablet (5 mg total) by mouth daily.   No facility-administered encounter medications on file as of 02/20/2022.    Past Medical History:  Diagnosis Date   CAD (coronary artery disease)    Acute MI in 2004 with subsequent CABG; cath 2009 60% left main; atretic LIMA; occluded SVG to M1; patent grafts to RCA and D1   Essential hypertension    Hyperlipidemia     Past Surgical History:  Procedure Laterality Date   CARDIAC CATHETERIZATION     CATARACT EXTRACTION W/PHACO Right 12/11/2015   Procedure: CATARACT EXTRACTION PHACO AND INTRAOCULAR LENS PLACEMENT ([facility]);  Surgeon: Rutherford Guys, MD;  Location: [facility];  Service: Ophthalmology;  Laterality: Right;  CDE: 9.44   CORONARY ARTERY BYPASS GRAFT  2004   HERNIA REPAIR      Social History   Socioeconomic History   Marital status: Widowed    Spouse name: Not on file   Number of children: 5   Years of education: Not on file   Highest education level: Not on file  Occupational History   Occupation: retired    Comment: [employer]  Tobacco Use   Smoking status: Former    Packs/day: 0.50    Years: 4.00    Total pack years: 2.00    Types: Cigarettes    Start date: 06/20/1942    Quit date: 12/22/1965    Years since quitting: 56.2   Smokeless tobacco: Never  Vaping Use   Vaping Use: Never used  Substance and Sexual Activity   Alcohol use: No    Alcohol/week: 0.0 standard drinks of alcohol   Drug use: No   Sexual activity: Not on file  Other Topics Concern   Not on file  Social History Narrative   Not on file   Social Determinants of Health   Financial Resource Strain: Not on file  Food Insecurity: Not on file  Transportation Needs: Not on file  Physical Activity: Not on file  Stress: Not on file  Social Connections: Not on file  Intimate Partner Violence: Not on file    Family History  Problem Relation Age of Onset    Heart disease Father        and a brother   Stroke Mother        and brother       Objective:   Physical Exam  Lab Results:  No  results found for this or any previous visit (from the past 24 hour(s)).   BMET No results for input(s): "NA", "K", "CL", "CO2", "GLUCOSE", "BUN", "CREATININE", "CALCIUM" in the last 72 hours. PSA No results found for: "PSA" No results found for: "TESTOSTERONE"  UA has 3-10 RBC's.   Studies/Results: DG Abd 1 View  Result Date: 02/15/2022 CLINICAL DATA:  Multiple bladder stones. EXAM: ABDOMEN - 1 VIEW COMPARISON:  Abdominal radiograph dated 01/14/2021 and CT abdomen pelvis dated 11/08/2018. FINDINGS: The bowel gas pattern is normal. Multiple urinary bladder stones are redemonstrated with the two largest measuring 2.8 x 2.1 cm and 2.6 x 1.6 cm. These appear slightly increased in size since 01/14/2021. No definite calculi are identified overlying the kidneys. Severe degenerative changes are seen in the lumbar spine. Phleboliths are seen in the pelvis. IMPRESSION: Urinary bladder stones appear slightly increased in size since 01/14/2021. Electronically Signed   By: Romona Curls M.D.   On: 02/15/2022 20:29       Assessment & Plan: BPH with BOO.  He is doing well on finasteride and tamsulosin. Finasteride refilled.  Dr. Sherwood Gambler fills the tamsulosin.   Bladder stones and small renal stones and microhematuria.   He had no obstruction and has no flank pain.  KUB shows a slight increase in the bladder stones.   ED.  He is no longer active.      Meds ordered this encounter  Medications   finasteride (PROSCAR) 5 MG tablet    Sig: Take 1 tablet (5 mg total) by mouth daily.    Dispense:  90 tablet    Refill:  3      Orders Placed This Encounter  Procedures   DG Abd 1 View    Standing Status:   Future    Standing Expiration Date:   02/21/2023    Order Specific Question:   Reason for Exam (SYMPTOM  OR DIAGNOSIS REQUIRED)    Answer:   bladder and renal  stones.    Order Specific Question:   Preferred imaging location?    Answer:   [facility]    Order Specific Question:   Radiology Contrast Protocol - do NOT remove file path    Answer:   [email or web address]   Urinalysis, Routine w reflex microscopic      Return in about 1 year (around 02/21/2023) for with KUB..   CC: Elfredia Nevins, MD      Bjorn Pippin 02/21/2022 Patient ID: Charlott Holler, male   DOB: [birth year], 88 y.o.   MRN: [id number]

## 2022-02-21 ENCOUNTER — Encounter: Payer: Self-pay | Admitting: Urology

## 2022-02-21 DIAGNOSIS — M5030 Other cervical disc degeneration, unspecified cervical region: Secondary | ICD-10-CM | POA: Diagnosis not present

## 2022-02-21 DIAGNOSIS — Z0001 Encounter for general adult medical examination with abnormal findings: Secondary | ICD-10-CM | POA: Diagnosis not present

## 2022-02-21 DIAGNOSIS — E063 Autoimmune thyroiditis: Secondary | ICD-10-CM | POA: Diagnosis not present

## 2022-02-21 DIAGNOSIS — I10 Essential (primary) hypertension: Secondary | ICD-10-CM | POA: Diagnosis not present

## 2022-02-21 DIAGNOSIS — Z6821 Body mass index (BMI) 21.0-21.9, adult: Secondary | ICD-10-CM | POA: Diagnosis not present

## 2022-02-21 LAB — MICROSCOPIC EXAMINATION: Bacteria, UA: NONE SEEN

## 2022-02-21 LAB — URINALYSIS, ROUTINE W REFLEX MICROSCOPIC
Bilirubin, UA: NEGATIVE
Glucose, UA: NEGATIVE
Ketones, UA: NEGATIVE
Leukocytes,UA: NEGATIVE
Nitrite, UA: NEGATIVE
Protein,UA: NEGATIVE
Specific Gravity, UA: <1.005 — ABNORMAL LOW (ref 1.005–1.030)
Urobilinogen, Ur: 0.2 mg/dL (ref 0.2–1.0)
pH, UA: 5.5 (ref 5.0–7.5)

## 2022-03-14 DIAGNOSIS — H40013 Open angle with borderline findings, low risk, bilateral: Secondary | ICD-10-CM | POA: Diagnosis not present

## 2022-03-14 DIAGNOSIS — H524 Presbyopia: Secondary | ICD-10-CM | POA: Diagnosis not present

## 2022-07-24 ENCOUNTER — Ambulatory Visit (INDEPENDENT_AMBULATORY_CARE_PROVIDER_SITE_OTHER): Payer: Medicare Other | Admitting: Urology

## 2022-07-24 ENCOUNTER — Encounter: Payer: Self-pay | Admitting: Urology

## 2022-07-24 VITALS — BP 152/70 | HR 75 | Ht 64.0 in | Wt 120.0 lb

## 2022-07-24 DIAGNOSIS — N3941 Urge incontinence: Secondary | ICD-10-CM

## 2022-07-24 DIAGNOSIS — N401 Enlarged prostate with lower urinary tract symptoms: Secondary | ICD-10-CM | POA: Diagnosis not present

## 2022-07-24 DIAGNOSIS — R350 Frequency of micturition: Secondary | ICD-10-CM | POA: Diagnosis not present

## 2022-07-24 DIAGNOSIS — N200 Calculus of kidney: Secondary | ICD-10-CM

## 2022-07-24 DIAGNOSIS — R3129 Other microscopic hematuria: Secondary | ICD-10-CM

## 2022-07-24 DIAGNOSIS — N138 Other obstructive and reflux uropathy: Secondary | ICD-10-CM | POA: Diagnosis not present

## 2022-07-24 DIAGNOSIS — N210 Calculus in bladder: Secondary | ICD-10-CM

## 2022-07-24 LAB — URINALYSIS, ROUTINE W REFLEX MICROSCOPIC
Bilirubin, UA: NEGATIVE
Glucose, UA: NEGATIVE
Ketones, UA: NEGATIVE
Leukocytes,UA: NEGATIVE
Nitrite, UA: NEGATIVE
Protein,UA: NEGATIVE
Specific Gravity, UA: 1.015 (ref 1.005–1.030)
Urobilinogen, Ur: 1 mg/dL (ref 0.2–1.0)
pH, UA: 5 (ref 5.0–7.5)

## 2022-07-24 LAB — BLADDER SCAN AMB NON-IMAGING: Scan Result: 29

## 2022-07-24 LAB — MICROSCOPIC EXAMINATION
Bacteria, UA: NONE SEEN
RBC, Urine: >30 /hpf — AB (ref 0–2)

## 2022-07-24 NOTE — Progress Notes (Signed)
post void residual =37m

## 2022-07-24 NOTE — Progress Notes (Signed)
Subjective:  1. BPH with urinary obstruction   2. Bladder stone   3. Renal stones   4. Urinary frequency   5. Urge incontinence   6. Microhematuria        [name]: Initially sent by Dr. Gerarda Fraction 9.6.2019 for a history of BPH with BOO with a rising PSA. He has been on tamsulosin for several years. He is voiding with a reduced stream in the morning but then he voids well the rest of the day. He has minimal nocturia but restricts fluids after 4pm. His PSA was 4.7 in 10/16 and was 5.3 in 6/19 and at his advanced age these levels were felt to be acceptable particularly with the size of his prostate.   11/18/19: He was last seen on 11/02/18 by Dr. Diona Fanti for microhematuria and increasing LUTS and was found to have a bladder stone that was reported a large but he didn't agree to therapy at that time.  A CT stone study on 11/08/18  demonstrated a few small right renal stones but there was a 5-61m right UVJ stone and there were two large bladder stones with the largest about 2.2cm.  He has a very large prostate with a prominent intravesical middle lobe.   The hematuria has cleared but he has nocturia 4-5x.  He drinks water, coffee, tea and Mtn Dews.   He has recently cut back on the fluids in the afternoon which helps.   He does well during the day.  He remains on tamsulosin.   He has no flank pain.    02/17/20:  WMurrell Conversereturns today in f/u.  He is on finasteride and tamsulosin and is content with the results.  He has minimal nocturia and a good stream.  He feels he empties well.  He has no dysuria or hematuria despite 2 large bladder stones and a RUVJ stone.   He has had no flank pain.  He has 2+ blood in the urine today.  He is interested in discussed ED treatment.    08/16/20: WShahzainreturns today in f/u.  He remainsn on finasteride and tamsulosin and is voiding well with an IPSS of 7.  He has nocturia x 2 and some frequency.   He has 11-30 RBC's today.  He has bladder and right ureteral stones but declined  therapy.  He has ED and has responded to tadalafil and sildenafil.    02/14/21: WAjaireturns today for the history noted above.   He is doing well with an IPSS of 13 which is up but he is very satisfied with his symptoms.  He stopped using the PDE5's because of a prolonged erection.  His UA has stable hematuria.  A KUB in 8/22 showed 2 stable bladder stones with the largest 2.5cm. His previous imaging was a CT in 2020 and the stones are without change.   He has no associated signs or symptoms.    02/20/22: WAlvisreturns today in f/u for his history of BPH with BOO and bladder stones.  The stones are slightly larger on KUB prior to this visit.  He remains on finasteride and tamsulosin.   Last week had a period of frequency and urgency every 4-5 minutes for about 8 hours and then it stopped.  He passed a couple of small clots at the end of that period.   07/24/22: WBryheemreturns today in f/u.   He had an episode in December of frequency q2-3 minutes with incontinence with difficulty voiding that lasted for about  8 hours but it will recur every 7-8 days.  He is using diapers just in case.   His UA today has >30 RBC's.  He remains on finasteride and tamsulosin.  He feels like he is emptying well unless he has the episodes.     ROS:  ROS:  A complete review of systems was performed.  All systems are negative except for pertinent findings as noted.   Review of Systems  HENT:  Positive for congestion.   Eyes:  Positive for blurred vision and double vision.  Cardiovascular:  Positive for leg swelling.  Neurological:  Positive for dizziness and weakness.  Psychiatric/Behavioral:  Positive for memory loss.     No Known Allergies  Outpatient Encounter Medications as of 07/24/2022  Medication Sig   Amino Acids (HIGH PROTEIN PO) Take by mouth.   aspirin 81 MG tablet Take 81 mg by mouth daily.   atorvastatin (LIPITOR) 40 MG tablet TAKE 1 TABLET ONCE DAILY.   carbamide peroxide (DEBROX) 6.5 % OTIC  solution Place 5 drops into both ears 2 (two) times daily.   finasteride (PROSCAR) 5 MG tablet Take 1 tablet (5 mg total) by mouth daily.   losartan (COZAAR) 50 MG tablet Take 50 mg by mouth daily.   metoprolol tartrate (LOPRESSOR) 25 MG tablet Take 0.5 tablets (12.5 mg total) by mouth 2 (two) times daily.   Polyvinyl Alcohol-Povidone (CLEAR EYES ALL SEASONS OP) Apply 1 drop to eye daily as needed (red eyes).   Tamsulosin HCl (FLOMAX) 0.4 MG CAPS Take 0.4 mg by mouth daily after supper.   No facility-administered encounter medications on file as of 07/24/2022.    Past Medical History:  Diagnosis Date   CAD (coronary artery disease)    Acute MI in 2004 with subsequent CABG; cath 2009 60% left main; atretic LIMA; occluded SVG to M1; patent grafts to RCA and D1   Essential hypertension    Hyperlipidemia     Past Surgical History:  Procedure Laterality Date   CARDIAC CATHETERIZATION     CATARACT EXTRACTION W/PHACO Right 12/11/2015   Procedure: CATARACT EXTRACTION PHACO AND INTRAOCULAR LENS PLACEMENT ([place]);  Surgeon: Rutherford Guys, MD;  Location: [facility];  Service: Ophthalmology;  Laterality: Right;  CDE: 9.44   CORONARY ARTERY BYPASS GRAFT  2004   HERNIA REPAIR      Social History   Socioeconomic History   Marital status: Widowed    Spouse name: Not on file   Number of children: 5   Years of education: Not on file   Highest education level: Not on file  Occupational History   Occupation: retired    Comment: [employer]  Tobacco Use   Smoking status: Former    Packs/day: 0.50    Years: 4.00    Total pack years: 2.00    Types: Cigarettes    Start date: 06/20/1942    Quit date: 12/22/1965    Years since quitting: 56.6   Smokeless tobacco: Never  Vaping Use   Vaping Use: Never used  Substance and Sexual Activity   Alcohol use: No    Alcohol/week: 0.0 standard drinks of alcohol   Drug use: No   Sexual activity: Not on file  Other Topics Concern   Not on file  Social  History Narrative   Not on file   Social Determinants of Health   Financial Resource Strain: Not on file  Food Insecurity: Not on file  Transportation Needs: Not on file  Physical Activity: Not on file  Stress: Not on file  Social Connections: Not on file  Intimate Partner Violence: Not on file    Family History  Problem Relation Age of Onset   Heart disease Father        and a brother   Stroke Mother        and brother       Objective:   Physical Exam  Lab Results:  Results for orders placed or performed in visit on 07/24/22 (from the past 24 hour(s))  Urinalysis, Routine w reflex microscopic     Status: Abnormal   Collection Time: 07/24/22  2:42 PM  Result Value Ref Range   Specific Gravity, UA 1.015 1.005 - 1.030   pH, UA 5.0 5.0 - 7.5   Color, UA Yellow Yellow   Appearance Ur Clear Clear   Leukocytes,UA Negative Negative   Protein,UA Negative Negative/Trace   Glucose, UA Negative Negative   Ketones, UA Negative Negative   RBC, UA 3+ (A) Negative   Bilirubin, UA Negative Negative   Urobilinogen, Ur 1.0 0.2 - 1.0 mg/dL   Nitrite, UA Negative Negative   Microscopic Examination See below:    Narrative   Performed at:  [facility] [address] [place], [place]  [id number] Lab Director: Mina Marble MT, Phone:  [phone]  Microscopic Examination     Status: Abnormal   Collection Time: 07/24/22  2:42 PM   Urine  Result Value Ref Range   WBC, UA 0-5 0 - 5 /hpf   RBC, Urine >30 (A) 0 - 2 /hpf   Epithelial Cells (non renal) 0-10 0 - 10 /hpf   Bacteria, UA None seen None seen/Few   Narrative   Performed at:  [facility], [place], [place]  [id number] Lab Director: Delta, Phone:  [phone]     BMET No results for input(s): "NA", "K", "CL", "CO2", "GLUCOSE", "BUN", "CREATININE", "CALCIUM" in the last 72 hours. PSA No results found for: "PSA" No results found for: "TESTOSTERONE"  UA has >30  RBC's.   Studies/Results: No results found.     Assessment & Plan: BPH with BOO.  He is having intermittent issues with frequency, urgency with incontinence and difficulty voiding that is likely from his stones.  He is otherwise doing well on finasteride and tamsulosin.   Bladder stones and small renal stones and microhematuria.   There is increased hematuria on UA today. He had no obstruction and has no flank pain.  I will get a KUB to assess the stones.   If he has progressive symptoms, we may need to consider surgical management even at his advanced age.   ED.  He is no longer active.      No orders of the defined types were placed in this encounter.     Orders Placed This Encounter  Procedures   Microscopic Examination   DG Abd 1 View    Standing Status:   Future    Number of Occurrences:   1    Standing Expiration Date:   10/22/2022    Order Specific Question:   Reason for Exam (SYMPTOM  OR DIAGNOSIS REQUIRED)    Answer:   bladder and renal stones    Order Specific Question:   Preferred imaging location?    Answer:   [facility]    Order Specific Question:   Radiology Contrast Protocol - do NOT remove file path    Answer:   [email or web address]  Urinalysis, Routine w reflex microscopic   Bladder Scan (Post Void Residual) in office      Return for Keep previously scheduled appointment in september. .   CC: Redmond School, MD      Irine Seal 07/25/2022 Patient ID: Kenyon Ana, male   DOB: Feb 21, 1925, 89 y.o.   MRN: [id number]

## 2022-07-25 ENCOUNTER — Ambulatory Visit (HOSPITAL_COMMUNITY)
Admission: RE | Admit: 2022-07-25 | Discharge: 2022-07-25 | Disposition: A | Discharge status: Home / Self Care | Payer: Medicare Other | Source: Ambulatory Visit | Attending: Urology | Admitting: Urology

## 2022-07-25 DIAGNOSIS — N210 Calculus in bladder: Secondary | ICD-10-CM | POA: Insufficient documentation

## 2022-07-25 DIAGNOSIS — I878 Other specified disorders of veins: Secondary | ICD-10-CM | POA: Diagnosis not present

## 2022-07-25 DIAGNOSIS — N200 Calculus of kidney: Secondary | ICD-10-CM | POA: Insufficient documentation

## 2022-07-25 DIAGNOSIS — M419 Scoliosis, unspecified: Secondary | ICD-10-CM | POA: Diagnosis not present

## 2022-07-25 DIAGNOSIS — Z87442 Personal history of urinary calculi: Secondary | ICD-10-CM | POA: Diagnosis not present

## 2022-08-09 DIAGNOSIS — R1310 Dysphagia, unspecified: Secondary | ICD-10-CM | POA: Diagnosis not present

## 2022-09-27 ENCOUNTER — Other Ambulatory Visit: Payer: Self-pay | Admitting: Student

## 2022-09-27 ENCOUNTER — Other Ambulatory Visit: Payer: Self-pay | Admitting: Cardiology

## 2022-10-28 ENCOUNTER — Other Ambulatory Visit: Payer: Self-pay | Admitting: Cardiology

## 2022-11-28 ENCOUNTER — Other Ambulatory Visit: Payer: Self-pay | Admitting: Cardiology

## 2022-12-05 ENCOUNTER — Other Ambulatory Visit: Payer: Self-pay | Admitting: Cardiology

## 2022-12-15 ENCOUNTER — Other Ambulatory Visit: Payer: Self-pay | Admitting: Cardiology

## 2022-12-22 ENCOUNTER — Encounter: Payer: Self-pay | Admitting: *Deleted

## 2022-12-22 NOTE — Progress Notes (Signed)
Pt attended 11/25/22 screening event where his b/p was 122/82. At the event, it was documented that his PCP is Dr. Elfredia Nevins and pt did not indicated any SDOH insecurities. Chart review indicates pt had his annual physical with Dr. Sherwood Gambler on 02/21/2022 and that he has ongoing cardiology support with Dr. Diona Browner at [facility] and ongoing urology care. Dr. Sharyon Medicus office also confirmed that they are his active PCP. No additional health equity team support indicated at this time.

## 2023-01-21 ENCOUNTER — Other Ambulatory Visit: Payer: Self-pay | Admitting: Cardiology

## 2023-02-18 ENCOUNTER — Encounter: Payer: Self-pay | Admitting: *Deleted

## 2023-02-18 ENCOUNTER — Encounter: Payer: Self-pay | Admitting: Student

## 2023-02-18 ENCOUNTER — Ambulatory Visit: Payer: Medicare Other | Attending: Student | Admitting: Student

## 2023-02-18 VITALS — BP 120/70 | HR 60 | Ht 64.0 in | Wt 126.4 lb

## 2023-02-18 DIAGNOSIS — R600 Localized edema: Secondary | ICD-10-CM

## 2023-02-18 DIAGNOSIS — I10 Essential (primary) hypertension: Secondary | ICD-10-CM | POA: Diagnosis not present

## 2023-02-18 DIAGNOSIS — I2510 Atherosclerotic heart disease of native coronary artery without angina pectoris: Secondary | ICD-10-CM

## 2023-02-18 DIAGNOSIS — E785 Hyperlipidemia, unspecified: Secondary | ICD-10-CM | POA: Diagnosis not present

## 2023-02-18 MED ORDER — LOSARTAN POTASSIUM 50 MG PO TABS: 50.0000 mg | ORAL_TABLET | Freq: Every day | ORAL | 3 refills | Status: AC

## 2023-02-18 MED ORDER — METOPROLOL TARTRATE 25 MG PO TABS: 12.5000 mg | ORAL_TABLET | Freq: Two times a day (BID) | ORAL | 3 refills | Status: AC

## 2023-02-18 MED ORDER — ATORVASTATIN CALCIUM 40 MG PO TABS: 40.0000 mg | ORAL_TABLET | Freq: Every day | ORAL | 3 refills | Status: AC

## 2023-02-18 NOTE — Progress Notes (Signed)
Cardiology Office Note    Date:  02/18/2023  ID:  ZORA COHO, DOB [birth year], MRN [id number] Cardiologist: Nona Dell, MD    History of Present Illness:    Brian Solomon is a 89 y.o. male with past medical history of CAD (s/p CABG in 2004 with catheterization in 2009 showing atretic LIMA and occluded SVG to OM1 with patent grafts to the RCA and D1 with medical management recommended), HTN and HLD who presents to the office today for annual follow-up.  He was examined by myself in 08/2021 and his wife had recently passed away. He did remain active at baseline and performed ADL's independently. He did not wish to undergo further cardiac testing or additional procedures given his advanced age. He was continued on his current medical therapy with ASA 81 mg daily, Atorvastatin 40 mg daily, Losartan 50 mg daily and Lopressor 12.5 mg twice daily.  In talking with the patient today, he reports overall doing well for his age. He continues to live independently and drives as well. Reports going to a local track and walks for 20 to 30 minutes a few days each week. He denies any recent chest pain or dyspnea on exertion with his routine activities.  No specific palpitations, orthopnea or PND. He does experience intermittent lower extremity edema and reports this has occurred since prior CABG in 2004. He does add salt to his food and does not plan to reduce his use given his age. He does check his blood pressure on occasion at his pharmacy and reports it is typically well-controlled.  Studies Reviewed:   EKG: EKG is ordered today and demonstrates:   EKG Interpretation Date/Time:  Wednesday February 18 2023 13:15:54 EDT Ventricular Rate:  65 PR Interval:  162 QRS Duration:  80 QT Interval:  394 QTC Calculation: 409 R Axis:   -19  Text Interpretation: Normal sinus rhythm with sinus arrhythmia Minimal voltage criteria for LVH, may be normal variant ( R in aVL ) Inferior infarct (cited on or  before 01-Nov-2002) Confirmed by Randall An ([id number]) on 02/18/2023 2:00:21 PM       Echocardiogram: 2011        Physical Exam:   VS:  BP 120/70   Pulse 60   Ht 5\' 4"  (1.626 m)   Wt 126 lb 6.4 oz (57.3 kg)   SpO2 94%   BMI 21.70 kg/m    Wt Readings from Last 3 Encounters:  02/18/23 126 lb 6.4 oz (57.3 kg)  07/24/22 120 lb (54.4 kg)  09/20/21 120 lb 6.4 oz (54.6 kg)     GEN: Pleasant, elderly male appearing in no acute distress NECK: No JVD; No carotid bruits CARDIAC: RRR, 2/6 systolic murmur along Apex.  RESPIRATORY:  Clear to auscultation without rales, wheezing or rhonchi  ABDOMEN: Appears non-distended. No obvious abdominal masses. EXTREMITIES: No clubbing or cyanosis. Trace lower extremity edema bilaterally.  Distal pedal pulses are 2+ bilaterally.   Assessment and Plan:   1. CAD - He has a history of prior CABG in 2004 with known atretic LIMA and occluded SVG-OM 1 as outlined above. He previously did not wish to undergo surveillance echocardiograms or repeat stress testing given that he has overall been doing well. We reviewed this again today and he continues to prefer medical management. - Continue current medical therapy with ASA 81 mg daily, Atorvastatin 40 mg daily, Losartan 50 mg daily and Lopressor 12.5 mg twice daily. We will request a copy of most recent labs  to make sure a recent FLP and CMET have been obtained. If not, would order as the most recent labs available for review are from 06/2021.  2. HTN - His blood pressure is well-controlled at 120/70 during today's visit. Continue current medical therapy with Losartan 50 mg daily and Lopressor 12.5 mg twice daily.  3. HLD - LDL was 71 in 2023 by review of [facility] DXA. He does report having repeat labs with his PCP in the interim as he thinks these were obtained earlier this year. Will request a copy. He remains on Atorvastatin 40 mg daily. Would not anticipate aggressive medication changes if LDL is above  goal given his age. He has overall preferred to remain on his current medication regimen as he has been doing well with this for 15+ years.  4. Lower Extremity Edema - This has been present for several years per his report with no acute changes. He declines further imaging so an echocardiogram has not been pursued. We reviewed his sodium intake is likely playing a role but he politely declines to change his diet given his age. He has not required diuretic therapy. Continue with conservative management for now and could consider using Lasix in the future if needed.    Signed, Ellsworth Lennox, PA-C

## 2023-02-18 NOTE — Patient Instructions (Signed)
Medication Instructions:  Your physician recommends that you continue on your current medications as directed. Please refer to the Current Medication list given to you today.  *If you need a refill on your cardiac medications before your next appointment, please call your pharmacy*   Lab Work: NONE   If you have labs (blood work) drawn today and your tests are completely normal, you will receive your results only by: [facility] (if you have MyChart) OR A paper copy in the mail If you have any lab test that is abnormal or we need to change your treatment, we will call you to review the results.   Testing/Procedures: NONE    Follow-Up: At [facility], you and your health needs are our priority.  As part of our continuing mission to provide you with exceptional heart care, we have created designated Provider Care Teams.  These Care Teams include your primary Cardiologist (physician) and Advanced Practice Providers (APPs -  Physician Assistants and Nurse Practitioners) who all work together to provide you with the care you need, when you need it.  We recommend signing up for the patient portal called "MyChart".  Sign up information is provided on this After Visit Summary.  MyChart is used to connect with patients for Virtual Visits (Telemedicine).  Patients are able to view lab/test results, encounter notes, upcoming appointments, etc.  Non-urgent messages can be sent to your provider as well.   To learn more about what you can do with MyChart, go to [email or web address].    Your next appointment:   1 year(s)  Provider:   You may see Rozann Lesches, MD or one of the following Advanced Practice Providers on your designated Care Team:   Bernerd Pho, PA-C  Ermalinda Barrios, PA-C     Other Instructions Thank you for choosing [facility]!

## 2023-02-24 ENCOUNTER — Other Ambulatory Visit: Payer: Self-pay

## 2023-02-24 ENCOUNTER — Ambulatory Visit (HOSPITAL_COMMUNITY)
Admission: RE | Admit: 2023-02-24 | Discharge: 2023-02-24 | Disposition: A | Discharge status: Home / Self Care | Payer: Medicare Other | Source: Ambulatory Visit | Attending: Urology | Admitting: Urology

## 2023-02-24 DIAGNOSIS — N200 Calculus of kidney: Secondary | ICD-10-CM

## 2023-02-26 ENCOUNTER — Ambulatory Visit: Payer: Medicare Other | Admitting: Urology

## 2023-02-26 ENCOUNTER — Encounter: Payer: Self-pay | Admitting: Urology

## 2023-02-26 VITALS — BP 138/80 | HR 53

## 2023-02-26 DIAGNOSIS — N210 Calculus in bladder: Secondary | ICD-10-CM | POA: Diagnosis not present

## 2023-02-26 DIAGNOSIS — R3129 Other microscopic hematuria: Secondary | ICD-10-CM | POA: Diagnosis not present

## 2023-02-26 DIAGNOSIS — N200 Calculus of kidney: Secondary | ICD-10-CM | POA: Diagnosis not present

## 2023-02-26 DIAGNOSIS — R350 Frequency of micturition: Secondary | ICD-10-CM | POA: Diagnosis not present

## 2023-02-26 DIAGNOSIS — N3941 Urge incontinence: Secondary | ICD-10-CM

## 2023-02-26 DIAGNOSIS — N138 Other obstructive and reflux uropathy: Secondary | ICD-10-CM | POA: Diagnosis not present

## 2023-02-26 DIAGNOSIS — R351 Nocturia: Secondary | ICD-10-CM

## 2023-02-26 DIAGNOSIS — N401 Enlarged prostate with lower urinary tract symptoms: Secondary | ICD-10-CM | POA: Diagnosis not present

## 2023-02-26 LAB — URINALYSIS, ROUTINE W REFLEX MICROSCOPIC
Bilirubin, UA: NEGATIVE
Glucose, UA: NEGATIVE
Ketones, UA: NEGATIVE
Leukocytes,UA: NEGATIVE
Nitrite, UA: NEGATIVE
Specific Gravity, UA: 1.02 (ref 1.005–1.030)
Urobilinogen, Ur: 1 mg/dL (ref 0.2–1.0)
pH, UA: 6 (ref 5.0–7.5)

## 2023-02-26 LAB — MICROSCOPIC EXAMINATION: Bacteria, UA: NONE SEEN

## 2023-02-26 MED ORDER — FINASTERIDE 5 MG PO TABS
5.0000 mg | ORAL_TABLET | Freq: Every day | ORAL | 3 refills | Status: AC
Start: 2023-02-26 — End: ?

## 2023-02-26 NOTE — Progress Notes (Signed)
Subjective:  1. Bladder stone   2. Renal stones   3. BPH with urinary obstruction   4. Urinary frequency   5. Urge incontinence   6. Microhematuria   7. Nocturia        [name]: Initially sent by Dr. Sherwood Gambler 9.6.2019 for a history of BPH with BOO with a rising PSA. He has been on tamsulosin for several years. He is voiding with a reduced stream in the morning but then he voids well the rest of the day. He has minimal nocturia but restricts fluids after 4pm. His PSA was 4.7 in 10/16 and was 5.3 in 6/19 and at his advanced age these levels were felt to be acceptable particularly with the size of his prostate.   11/18/19: He was last seen on 11/02/18 by Dr. Retta Diones for microhematuria and increasing LUTS and was found to have a bladder stone that was reported a large but he didn't agree to therapy at that time.  A CT stone study on 11/08/18  demonstrated a few small right renal stones but there was a 5-20mm right UVJ stone and there were two large bladder stones with the largest about 2.2cm.  He has a very large prostate with a prominent intravesical middle lobe.   The hematuria has cleared but he has nocturia 4-5x.  He drinks water, coffee, tea and Mtn Dews.   He has recently cut back on the fluids in the afternoon which helps.   He does well during the day.  He remains on tamsulosin.   He has no flank pain.    02/17/20:  Barry Dienes returns today in f/u.  He is on finasteride and tamsulosin and is content with the results.  He has minimal nocturia and a good stream.  He feels he empties well.  He has no dysuria or hematuria despite 2 large bladder stones and a RUVJ stone.   He has had no flank pain.  He has 2+ blood in the urine today.  He is interested in discussed ED treatment.    IPSS     Row Name 02/26/23 1400         International Prostate Symptom Score   How often have you had the sensation of not emptying your bladder? Less than half the time     How often have you had to urinate less than every  two hours? Less than half the time     How often have you found you stopped and started again several times when you urinated? Less than 1 in 5 times     How often have you found it difficult to postpone urination? Not at All     How often have you had a weak urinary stream? About half the time     How often have you had to strain to start urination? About half the time     How many times did you typically get up at night to urinate? 3 Times     Total IPSS Score 14       Quality of Life due to urinary symptoms   If you were to spend the rest of your life with your urinary condition just the way it is now how would you feel about that? Mostly Satisfied             08/16/20: Mathews returns today in f/u.  He remainsn on finasteride and tamsulosin and is voiding well with an IPSS of 7.  He has nocturia x 2  and some frequency.   He has 11-30 RBC's today.  He has bladder and right ureteral stones but declined therapy.  He has ED and has responded to tadalafil and sildenafil.    02/14/21: Jobani returns today for the history noted above.   He is doing well with an IPSS of 13 which is up but he is very satisfied with his symptoms.  He stopped using the PDE5's because of a prolonged erection.  His UA has stable hematuria.  A KUB in 8/22 showed 2 stable bladder stones with the largest 2.5cm. His previous imaging was a CT in 2020 and the stones are without change.   He has no associated signs or symptoms.    02/20/22: Jarmall returns today in f/u for his history of BPH with BOO and bladder stones.  The stones are slightly larger on KUB prior to this visit.  He remains on finasteride and tamsulosin.   Last week had a period of frequency and urgency every 4-5 minutes for about 8 hours and then it stopped.  He passed a couple of small clots at the end of that period.   07/24/22: Zaylin returns today in f/u.   He had an episode in December of frequency q2-3 minutes with incontinence with difficulty voiding that  lasted for about 8 hours but it will recur every 7-8 days.  He is using diapers just in case.   His UA today has >30 RBC's.  He remains on finasteride and tamsulosin.  He feels like he is emptying well unless he has the episodes.     9/26/24Ivar Drape returns today in f/u.  He ahs BPH with BOO and multiple bladder stones.   The stones are stable on KUB from 2/24 and the 2 largest are 2.7 and 2.3cm.Marland Kitchen  He remains on finasteride and tamsulosin.  He has had no hematuria. He has occasional UUI and will wear a diaper to church.  His IPSS is 14.    ROS:  ROS:  A complete review of systems was performed.  All systems are negative except for pertinent findings as noted.   Review of Systems  HENT:  Positive for congestion.   Eyes:  Positive for blurred vision and double vision.  Cardiovascular:  Positive for leg swelling.  Neurological:  Positive for dizziness and weakness.  Psychiatric/Behavioral:  Positive for memory loss.   All other systems reviewed and are negative.   No Known Allergies  Outpatient Encounter Medications as of 02/26/2023  Medication Sig   Amino Acids (HIGH PROTEIN PO) Take by mouth.   aspirin 81 MG tablet Take 81 mg by mouth daily.   atorvastatin (LIPITOR) 40 MG tablet Take 1 tablet (40 mg total) by mouth daily.   carbamide peroxide (DEBROX) 6.5 % OTIC solution Place 5 drops into both ears 2 (two) times daily.   losartan (COZAAR) 50 MG tablet Take 1 tablet (50 mg total) by mouth daily.   metoprolol tartrate (LOPRESSOR) 25 MG tablet Take 0.5 tablets (12.5 mg total) by mouth 2 (two) times daily.   Polyvinyl Alcohol-Povidone (CLEAR EYES ALL SEASONS OP) Apply 1 drop to eye daily as needed (red eyes).   Tamsulosin HCl (FLOMAX) 0.4 MG CAPS Take 0.4 mg by mouth daily after supper.   [DISCONTINUED] finasteride (PROSCAR) 5 MG tablet Take 1 tablet (5 mg total) by mouth daily.   finasteride (PROSCAR) 5 MG tablet Take 1 tablet (5 mg total) by mouth daily.   No facility-administered  encounter medications on file as  of 02/26/2023.    Past Medical History:  Diagnosis Date   CAD (coronary artery disease)    Acute MI in 2004 with subsequent CABG; cath 2009 60% left main; atretic LIMA; occluded SVG to M1; patent grafts to RCA and D1   Essential hypertension    Hyperlipidemia     Past Surgical History:  Procedure Laterality Date   CARDIAC CATHETERIZATION     CATARACT EXTRACTION W/PHACO Right 12/11/2015   Procedure: CATARACT EXTRACTION PHACO AND INTRAOCULAR LENS PLACEMENT (IOC);  Surgeon: Jethro Bolus, MD;  Location: [facility];  Service: Ophthalmology;  Laterality: Right;  CDE: 9.44   CORONARY ARTERY BYPASS GRAFT  2004   HERNIA REPAIR      Social History   Socioeconomic History   Marital status: Widowed    Spouse name: Not on file   Number of children: 5   Years of education: Not on file   Highest education level: Not on file  Occupational History   Occupation: retired    Comment: [employer]  Tobacco Use   Smoking status: Former    Current packs/day: 0.00    Average packs/day: 0.5 packs/day for 23.5 years (11.8 ttl pk-yrs)    Types: Cigarettes    Start date: 06/20/1942    Quit date: 12/22/1965    Years since quitting: 57.2   Smokeless tobacco: Never  Vaping Use   Vaping status: Never Used  Substance and Sexual Activity   Alcohol use: No    Alcohol/week: 0.0 standard drinks of alcohol   Drug use: No   Sexual activity: Not on file  Other Topics Concern   Not on file  Social History Narrative   Not on file   Social Determinants of Health   Financial Resource Strain: Not on file  Food Insecurity: No Food Insecurity (11/25/2022)   Hunger Vital Sign    Worried About Running Out of Food in the Last Year: Never true    Ran Out of Food in the Last Year: Never true  Transportation Needs: No Transportation Needs (11/22/2022)   PRAPARE - Administrator, Civil Service (Medical): No    Lack of Transportation (Non-Medical): No  Physical Activity:  Not on file  Stress: Not on file  Social Connections: Not on file  Intimate Partner Violence: Not At Risk (11/22/2022)   Humiliation, Afraid, Rape, and Kick questionnaire    Fear of Current or Ex-Partner: No    Emotionally Abused: No    Physically Abused: No    Sexually Abused: No    Family History  Problem Relation Age of Onset   Heart disease Father        and a brother   Stroke Mother        and brother       Objective:   Physical Exam Vitals reviewed.  Constitutional:      Appearance: Normal appearance.  Neurological:     Mental Status: He is alert.     Lab Results:  No results found for this or any previous visit (from the past 24 hour(s)).    BMET No results for input(s): "NA", "K", "CL", "CO2", "GLUCOSE", "BUN", "CREATININE", "CALCIUM" in the last 72 hours. PSA No results found for: "PSA" No results found for: "TESTOSTERONE"  UA has 11-30 RBC's.   Studies/Results: No results found.  KUB shows stable bladder stones but no obvious renal stones.     Assessment & Plan: BPH with BOO.  He is having intermittent issues with frequency, urgency  with incontinence and difficulty voiding that is likely from his stones.  He is otherwise doing well on finasteride and tamsulosin.  Finasteride refilled.   F/U in a year  Bladder stones and small renal stones and microhematuria.   There is stable hematuria on UA today. He had no obstruction and has no flank pain.   KUB on return.   ED.  He is no longer active.      Meds ordered this encounter  Medications   finasteride (PROSCAR) 5 MG tablet    Sig: Take 1 tablet (5 mg total) by mouth daily.    Dispense:  90 tablet    Refill:  3      Orders Placed This Encounter  Procedures   Microscopic Examination   DG Abd 1 View    Standing Status:   Future    Standing Expiration Date:   02/26/2024    Order Specific Question:   Reason for Exam (SYMPTOM  OR DIAGNOSIS REQUIRED)    Answer:   bladder and renal stones     Order Specific Question:   Preferred imaging location?    Answer:   [facility]    Order Specific Question:   Radiology Contrast Protocol - do NOT remove file path    Answer:   [email or web address]   Urinalysis, Routine w reflex microscopic      Return in about 1 year (around 02/26/2024) for with KUB.   CC: Elfredia Nevins, MD      Bjorn Pippin 02/28/2023 Patient ID: Charlott Holler, male   DOB: 01-Jan-1925, 89 y.o.   MRN: [id number]

## 2023-03-12 DIAGNOSIS — H524 Presbyopia: Secondary | ICD-10-CM | POA: Diagnosis not present

## 2023-03-12 DIAGNOSIS — H35033 Hypertensive retinopathy, bilateral: Secondary | ICD-10-CM | POA: Diagnosis not present

## 2023-03-19 DIAGNOSIS — B356 Tinea cruris: Secondary | ICD-10-CM | POA: Diagnosis not present

## 2023-03-19 DIAGNOSIS — I10 Essential (primary) hypertension: Secondary | ICD-10-CM | POA: Diagnosis not present

## 2023-03-19 DIAGNOSIS — Z6821 Body mass index (BMI) 21.0-21.9, adult: Secondary | ICD-10-CM | POA: Diagnosis not present

## 2023-03-19 DIAGNOSIS — M5030 Other cervical disc degeneration, unspecified cervical region: Secondary | ICD-10-CM | POA: Diagnosis not present

## 2023-08-14 DIAGNOSIS — E559 Vitamin D deficiency, unspecified: Secondary | ICD-10-CM | POA: Diagnosis not present

## 2023-08-14 DIAGNOSIS — E782 Mixed hyperlipidemia: Secondary | ICD-10-CM | POA: Diagnosis not present

## 2023-08-14 DIAGNOSIS — Z0001 Encounter for general adult medical examination with abnormal findings: Secondary | ICD-10-CM | POA: Diagnosis not present

## 2023-08-14 DIAGNOSIS — I10 Essential (primary) hypertension: Secondary | ICD-10-CM | POA: Diagnosis not present

## 2023-08-14 DIAGNOSIS — Z6823 Body mass index (BMI) 23.0-23.9, adult: Secondary | ICD-10-CM | POA: Diagnosis not present

## 2023-08-14 DIAGNOSIS — I2510 Atherosclerotic heart disease of native coronary artery without angina pectoris: Secondary | ICD-10-CM | POA: Diagnosis not present

## 2023-08-14 DIAGNOSIS — M5030 Other cervical disc degeneration, unspecified cervical region: Secondary | ICD-10-CM | POA: Diagnosis not present

## 2023-09-30 DIAGNOSIS — I10 Essential (primary) hypertension: Secondary | ICD-10-CM | POA: Diagnosis not present

## 2023-09-30 DIAGNOSIS — M5030 Other cervical disc degeneration, unspecified cervical region: Secondary | ICD-10-CM | POA: Diagnosis not present

## 2023-09-30 DIAGNOSIS — Z6823 Body mass index (BMI) 23.0-23.9, adult: Secondary | ICD-10-CM | POA: Diagnosis not present

## 2023-09-30 DIAGNOSIS — H8110 Benign paroxysmal vertigo, unspecified ear: Secondary | ICD-10-CM | POA: Diagnosis not present

## 2023-12-25 DIAGNOSIS — Z299 Encounter for prophylactic measures, unspecified: Secondary | ICD-10-CM | POA: Diagnosis not present

## 2023-12-25 DIAGNOSIS — I10 Essential (primary) hypertension: Secondary | ICD-10-CM | POA: Diagnosis not present

## 2023-12-25 DIAGNOSIS — E7800 Pure hypercholesterolemia, unspecified: Secondary | ICD-10-CM | POA: Diagnosis not present

## 2023-12-25 DIAGNOSIS — R42 Dizziness and giddiness: Secondary | ICD-10-CM | POA: Diagnosis not present

## 2023-12-25 DIAGNOSIS — E039 Hypothyroidism, unspecified: Secondary | ICD-10-CM | POA: Diagnosis not present

## 2024-01-27 DIAGNOSIS — Z0000 Encounter for general adult medical examination without abnormal findings: Secondary | ICD-10-CM | POA: Diagnosis not present

## 2024-01-27 DIAGNOSIS — Z1389 Encounter for screening for other disorder: Secondary | ICD-10-CM | POA: Diagnosis not present

## 2024-01-27 DIAGNOSIS — Z299 Encounter for prophylactic measures, unspecified: Secondary | ICD-10-CM | POA: Diagnosis not present

## 2024-01-27 DIAGNOSIS — Z7189 Other specified counseling: Secondary | ICD-10-CM | POA: Diagnosis not present

## 2024-01-27 DIAGNOSIS — I10 Essential (primary) hypertension: Secondary | ICD-10-CM | POA: Diagnosis not present

## 2024-02-26 DIAGNOSIS — R5383 Other fatigue: Secondary | ICD-10-CM | POA: Diagnosis not present

## 2024-02-26 DIAGNOSIS — Z23 Encounter for immunization: Secondary | ICD-10-CM | POA: Diagnosis not present

## 2024-02-26 DIAGNOSIS — Z299 Encounter for prophylactic measures, unspecified: Secondary | ICD-10-CM | POA: Diagnosis not present

## 2024-02-26 DIAGNOSIS — I10 Essential (primary) hypertension: Secondary | ICD-10-CM | POA: Diagnosis not present

## 2024-03-24 ENCOUNTER — Encounter: Payer: Self-pay | Admitting: Cardiology

## 2024-03-24 ENCOUNTER — Ambulatory Visit: Attending: Cardiology | Admitting: Cardiology

## 2024-03-24 VITALS — BP 138/74 | HR 58 | Ht 64.0 in | Wt 124.0 lb

## 2024-03-24 DIAGNOSIS — I10 Essential (primary) hypertension: Secondary | ICD-10-CM | POA: Diagnosis not present

## 2024-03-24 DIAGNOSIS — I2510 Atherosclerotic heart disease of native coronary artery without angina pectoris: Secondary | ICD-10-CM

## 2024-03-24 DIAGNOSIS — I25119 Atherosclerotic heart disease of native coronary artery with unspecified angina pectoris: Secondary | ICD-10-CM | POA: Diagnosis not present

## 2024-03-24 DIAGNOSIS — E782 Mixed hyperlipidemia: Secondary | ICD-10-CM

## 2024-03-24 NOTE — Progress Notes (Signed)
    Cardiology Office Note  Date: 03/24/2024   ID: Brian Solomon, DOB March 23, 1925, MRN [id number]  History of Present Illness: Brian Solomon is a 89 y.o. male last seen in September 2024 by Ms. Strader PA-C, I reviewed her note.  I have not seen him since 2021.  He is here for a follow-up visit.  He does not report any angina at current level of activity.  Remains functional as far as ADLs, still driving and lives in his own home.  He is in the process of establishing with a new PCP following the closure of [facility].  I went over his medications.  I talked with him today about considering stopping his Lopressor  given bradycardia and propensity to conduction system disease in his 90s.  He prefers keeping everything stable since he has done very well symptomatically.  I reviewed his lab work from August.  I reviewed his ECG today which shows sinus bradycardia/arrhythmia with old inferior infarct pattern.  Physical Exam: VS:  BP 138/74 (BP Location: Left Arm, Cuff Size: Normal)   Pulse (!) 58   Ht 5' 4 (1.626 m)   Wt 124 lb (56.2 kg)   BMI 21.28 kg/m , BMI Body mass index is 21.28 kg/m.  Wt Readings from Last 3 Encounters:  03/24/24 124 lb (56.2 kg)  02/18/23 126 lb 6.4 oz (57.3 kg)  07/24/22 120 lb (54.4 kg)    General: Patient appears comfortable at rest. HEENT: Conjunctiva and lids normal. Neck: Supple, no elevated JVP or carotid bruits. Lungs: Clear to auscultation, nonlabored breathing at rest. Cardiac: Regular rate and rhythm, no S3, 1/6 systolic murmur. Extremities: No pitting edema.  ECG:  An ECG dated 02/18/2023 was personally reviewed today and demonstrated:  Sinus rhythm with PACs, increased voltage, old inferior infarct pattern.  Labwork:  August 2025: Hemoglobin 13, platelets 163, BUN 31, creatinine 1.1, GFR 61, potassium 4.3, AST 28, ALT 18, cholesterol 148, triglycerides 75, HDL 75, LDL 58, TSH 4.07  Other Studies Reviewed Today:  No interval cardiac testing  for review today.  Assessment and Plan:  1.  Multivessel CAD status post inferior infarct in 2004 with subsequent CABG including LIMA to LAD, SVG to first diagonal, SVG to OM1, and SVG to RCA. Cardiac catheterization 2009 showed atretic LIMA and occluded SVG to OM1, both managed medically.  He continues to do well with no active angina and prefers overall conservative management.  I reviewed his ECG.  For now he will continue aspirin 81 mg daily and Lipitor 40 mg daily.  2.  Primary hypertension.  No changes made to current regimen.  He is on Cozaar  50 mg daily and Lopressor  12.5 mg twice daily.  3.  Mixed hyperlipidemia.  LDL 58 in August.  Continue Lipitor 40 mg daily.  Disposition:  Follow up 1 year.  Signed, Jayson JUDITHANN Sierras, M.D., F.A.C.C. [facility] at [facility]

## 2024-03-24 NOTE — Patient Instructions (Signed)
 Medication Instructions:  Your physician recommends that you continue on your current medications as directed. Please refer to the Current Medication list given to you today.   Labwork: None today  Testing/Procedures: None today  Follow-Up: 1 year  Any Other Special Instructions Will Be Listed Below (If Applicable).  If you need a refill on your cardiac medications before your next appointment, please call your pharmacy.

## 2024-07-04 ENCOUNTER — Ambulatory Visit: Admitting: Urology

## 2024-09-05 ENCOUNTER — Ambulatory Visit: Admitting: Urology
